# Patient Record
Sex: Male | Born: 1975 | Race: Black or African American | Hispanic: No | Smoking: Current every day smoker
Health system: Southern US, Community
[De-identification: ages and names within clinical notes are randomized; demographics above are authoritative.]

## PROBLEM LIST (undated history)

## (undated) DIAGNOSIS — IMO0002 Reserved for concepts with insufficient information to code with codable children: Secondary | ICD-10-CM

## (undated) DIAGNOSIS — K631 Perforation of intestine (nontraumatic): Secondary | ICD-10-CM

## (undated) DIAGNOSIS — E119 Type 2 diabetes mellitus without complications: Secondary | ICD-10-CM

## (undated) HISTORY — PX: ABDOMINAL SURGERY: SHX537

## (undated) HISTORY — PX: CHOLECYSTECTOMY: SHX55

---

## 2009-09-05 ENCOUNTER — Emergency Department (HOSPITAL_COMMUNITY): Admission: EM | Admit: 2009-09-05 | Discharge: 2009-09-05 | Payer: Self-pay | Admitting: Emergency Medicine

## 2010-02-28 ENCOUNTER — Emergency Department (HOSPITAL_COMMUNITY): Admission: EM | Admit: 2010-02-28 | Discharge: 2010-02-28 | Payer: Self-pay | Admitting: Emergency Medicine

## 2010-09-21 ENCOUNTER — Emergency Department (HOSPITAL_COMMUNITY)
Admission: EM | Admit: 2010-09-21 | Discharge: 2010-09-21 | Disposition: A | Discharge status: Home / Self Care | Payer: Medicare Other | Attending: Emergency Medicine | Admitting: Emergency Medicine

## 2010-09-21 DIAGNOSIS — K029 Dental caries, unspecified: Secondary | ICD-10-CM | POA: Insufficient documentation

## 2010-09-21 DIAGNOSIS — K089 Disorder of teeth and supporting structures, unspecified: Secondary | ICD-10-CM | POA: Insufficient documentation

## 2011-06-14 ENCOUNTER — Emergency Department (HOSPITAL_COMMUNITY)
Admission: EM | Admit: 2011-06-14 | Discharge: 2011-06-14 | Disposition: A | Discharge status: Home / Self Care | Payer: Medicare Other | Attending: Emergency Medicine | Admitting: Emergency Medicine

## 2011-06-14 ENCOUNTER — Encounter: Payer: Self-pay | Admitting: *Deleted

## 2011-06-14 DIAGNOSIS — F17200 Nicotine dependence, unspecified, uncomplicated: Secondary | ICD-10-CM | POA: Insufficient documentation

## 2011-06-14 DIAGNOSIS — X58XXXA Exposure to other specified factors, initial encounter: Secondary | ICD-10-CM | POA: Insufficient documentation

## 2011-06-14 DIAGNOSIS — S39012A Strain of muscle, fascia and tendon of lower back, initial encounter: Secondary | ICD-10-CM

## 2011-06-14 DIAGNOSIS — S335XXA Sprain of ligaments of lumbar spine, initial encounter: Secondary | ICD-10-CM | POA: Insufficient documentation

## 2011-06-14 MED ORDER — OXYCODONE-ACETAMINOPHEN 5-325 MG PO TABS
1.0000 | ORAL_TABLET | Freq: Once | ORAL | Status: AC
  Administered 2011-06-14: 1 via ORAL
  Filled 2011-06-14: qty 1

## 2011-06-14 MED ORDER — HYDROCODONE-ACETAMINOPHEN 5-325 MG PO TABS: ORAL_TABLET | ORAL | Status: AC

## 2011-06-14 MED ORDER — METHOCARBAMOL 500 MG PO TABS
1000.0000 mg | ORAL_TABLET | Freq: Once | ORAL | Status: AC
  Administered 2011-06-14: 1000 mg via ORAL
  Filled 2011-06-14: qty 2

## 2011-06-14 MED ORDER — CYCLOBENZAPRINE HCL 10 MG PO TABS: 10.0000 mg | ORAL_TABLET | Freq: Three times a day (TID) | ORAL | Status: AC | PRN

## 2011-06-14 MED ORDER — NAPROXEN 500 MG PO TABS: 500.0000 mg | ORAL_TABLET | Freq: Two times a day (BID) | ORAL | Status: AC

## 2011-06-14 NOTE — ED Provider Notes (Signed)
History     CSN: [id number] Arrival date & time: 06/14/2011  9:25 AM   First MD Initiated Contact with Patient 06/14/11 [phone]      Chief Complaint  Patient presents with  . Back Pain    (Consider location/radiation/quality/duration/timing/severity/associated sxs/prior treatment) HPI Comments: Patient c/o lower back pain and "stiffness" for three days.  Sx's began after raking leaves.  Pain is worse with movement, bending or twisting.  Improves with rest.  He denies numbness, weakness, incontinence of feces or urine or dysuria sx's.    Patient is a 35 y.o. male presenting with back pain. The history is provided by the patient.  Back Pain  This is a new problem. The current episode started more than 2 days ago. The problem occurs constantly. The problem has been gradually worsening. The pain is associated with twisting. The pain is present in the lumbar spine. The quality of the pain is described as aching. The pain is moderate. The symptoms are aggravated by bending, twisting and certain positions. The pain is the same all the time. Pertinent negatives include no chest pain, no fever, no numbness, no abdominal pain, no abdominal swelling, no bowel incontinence, no perianal numbness, no bladder incontinence, no dysuria, no pelvic pain, no leg pain, no paresthesias, no paresis, no tingling and no weakness. Treatments tried: tylenol. The treatment provided no relief.    History reviewed. No pertinent past medical history.  Past Surgical History  Procedure Date  . Abdominal surgery     History reviewed. No pertinent family history.  History  Substance Use Topics  . Smoking status: Current Everyday Smoker -- 0.5 packs/day  . Smokeless tobacco: Not on file  . Alcohol Use: Yes     occasional      Review of Systems  Constitutional: Negative for fever, chills and fatigue.  HENT: Negative for neck pain and neck stiffness.   Respiratory: Negative for cough, chest tightness, shortness  of breath and wheezing.   Cardiovascular: Negative for chest pain and palpitations.  Gastrointestinal: Negative for nausea, vomiting, abdominal pain, blood in stool and bowel incontinence.  Genitourinary: Negative for bladder incontinence, dysuria, hematuria, flank pain, scrotal swelling, penile pain, testicular pain and pelvic pain.  Musculoskeletal: Positive for back pain. Negative for myalgias, joint swelling and arthralgias.  Skin: Negative for rash.  Neurological: Negative for dizziness, tingling, weakness, numbness and paresthesias.  Hematological: Does not bruise/bleed easily.  All other systems reviewed and are negative.    Allergies  Penicillins  Home Medications   Current Outpatient Rx  Name Route Sig Dispense Refill  . ACETAMINOPHEN 500 MG PO TABS Oral Take 500 mg by mouth every 6 (six) hours as needed. pain       BP 131/77  Pulse 77  Temp(Src) 98.8 F (37.1 C) (Oral)  Resp 18  Ht 6\' 1"  (1.854 m)  Wt 205 lb (92.987 kg)  BMI 27.05 kg/m2  SpO2 100%  Physical Exam  Nursing note and vitals reviewed. Constitutional: He is oriented to person, place, and time. He appears well-developed and well-nourished. No distress.  HENT:  Head: Normocephalic and atraumatic.  Neck: Normal range of motion. Neck supple.  Cardiovascular: Normal rate, regular rhythm and normal heart sounds.   Pulmonary/Chest: Effort normal and breath sounds normal. No respiratory distress. He exhibits no tenderness.  Abdominal: Soft. He exhibits no distension and no mass. There is no tenderness. There is no rebound and no guarding.  Musculoskeletal: Normal range of motion. He exhibits tenderness. He exhibits no  edema.       Lumbar back: He exhibits tenderness and spasm. He exhibits normal range of motion, no bony tenderness, no swelling, no laceration and normal pulse.  Lymphadenopathy:    He has no cervical adenopathy.  Neurological: He is alert and oriented to person, place, and time. No cranial  nerve deficit or sensory deficit. He exhibits normal muscle tone. Coordination and gait normal.  Reflex Scores:      Patellar reflexes are 2+ on the right side and 2+ on the left side.      Achilles reflexes are 2+ on the right side and 2+ on the left side. Skin: Skin is warm and dry.    ED Course  Procedures (including critical care time)       MDM    10:14 AM ttp of the lumbar paraspinal muscles.  No bony tenderness on exam.  Pt is ambulatory, nml appearing gait.  DTR's nml. likley lumbar strain..  Will treat with pain meds, NSAIDs, muscle relaxer's.  Pt agrees to return here if sx's worsen.          Arnetha Silverthorne L. Woodson Macha, PA 06/14/11 1020

## 2011-06-14 NOTE — ED Provider Notes (Signed)
Medical screening examination/treatment/procedure(s) were performed by non-physician practitioner and as supervising physician I was immediately available for consultation/collaboration.   Benny Lennert, MD 06/14/11 [phone]

## 2011-06-14 NOTE — ED Notes (Signed)
Pt c/o lower back pain. Pt states pain began 3 days ago and has gotten worse. Pt denies injury.

## 2012-04-20 ENCOUNTER — Encounter (HOSPITAL_COMMUNITY): Payer: Self-pay | Admitting: *Deleted

## 2012-04-20 ENCOUNTER — Emergency Department (HOSPITAL_COMMUNITY)
Admission: EM | Admit: 2012-04-20 | Discharge: 2012-04-20 | Disposition: A | Discharge status: Home / Self Care | Payer: Medicare Other | Attending: Emergency Medicine | Admitting: Emergency Medicine

## 2012-04-20 DIAGNOSIS — S025XXA Fracture of tooth (traumatic), initial encounter for closed fracture: Secondary | ICD-10-CM

## 2012-04-20 DIAGNOSIS — X58XXXA Exposure to other specified factors, initial encounter: Secondary | ICD-10-CM | POA: Insufficient documentation

## 2012-04-20 DIAGNOSIS — F17200 Nicotine dependence, unspecified, uncomplicated: Secondary | ICD-10-CM | POA: Insufficient documentation

## 2012-04-20 DIAGNOSIS — Z880 Allergy status to penicillin: Secondary | ICD-10-CM | POA: Insufficient documentation

## 2012-04-20 MED ORDER — OXYCODONE-ACETAMINOPHEN 5-325 MG PO TABS
2.0000 | ORAL_TABLET | Freq: Once | ORAL | Status: AC
  Administered 2012-04-20: 2 via ORAL
  Filled 2012-04-20: qty 2

## 2012-04-20 MED ORDER — CLINDAMYCIN HCL 150 MG PO CAPS
300.0000 mg | ORAL_CAPSULE | Freq: Once | ORAL | Status: AC
  Administered 2012-04-20: 300 mg via ORAL
  Filled 2012-04-20: qty 2

## 2012-04-20 MED ORDER — OXYCODONE-ACETAMINOPHEN 5-325 MG PO TABS: 1.0000 | ORAL_TABLET | Freq: Four times a day (QID) | ORAL | Status: DC | PRN

## 2012-04-20 MED ORDER — CLINDAMYCIN HCL 150 MG PO CAPS: 150.0000 mg | ORAL_CAPSULE | Freq: Four times a day (QID) | ORAL | Status: DC

## 2012-04-20 MED ORDER — IBUPROFEN 800 MG PO TABS
800.0000 mg | ORAL_TABLET | Freq: Once | ORAL | Status: AC
  Administered 2012-04-20: 800 mg via ORAL
  Filled 2012-04-20: qty 1

## 2012-04-20 NOTE — ED Notes (Signed)
Family at bedside. Patient and family do not need anything at this time. 

## 2012-04-20 NOTE — ED Notes (Signed)
Pt discharged. Pt stable at time of discharge. Medications reviewed pt has no questions regarding discharge at this time. Pt voiced understanding of discharge instructions.  

## 2012-04-20 NOTE — ED Provider Notes (Signed)
History     CSN: [id number]  Arrival date & time 04/20/12  4098   First MD Initiated Contact with Patient 04/20/12 0411      Chief Complaint  Patient presents with  . Dental Pain    (Consider location/radiation/quality/duration/timing/severity/associated sxs/prior treatment) HPI.Marland KitchenMarland KitchenDental Periodontal Exam pain for approximately one week.    Patient broke off segment of left lower tooth accidentally.  Now with pain in left lower tooth. Chewing makes pain worse. Severity is moderate.   History reviewed. No pertinent past medical history.  Past Surgical History  Procedure Date  . Abdominal surgery     History reviewed. No pertinent family history.  History  Substance Use Topics  . Smoking status: Current Every Day Smoker -- 0.5 packs/day  . Smokeless tobacco: Not on file  . Alcohol Use: Yes     occasional      Review of Systems  All other systems reviewed and are negative.    Allergies  Penicillins  Home Medications   Current Outpatient Rx  Name Route Sig Dispense Refill  . ACETAMINOPHEN 500 MG PO TABS Oral Take 500 mg by mouth every 6 (six) hours as needed. pain     . CLINDAMYCIN HCL 150 MG PO CAPS Oral Take 1 capsule (150 mg total) by mouth every 6 (six) hours. 28 capsule 0  . NAPROXEN 500 MG PO TABS Oral Take 1 tablet (500 mg total) by mouth 2 (two) times daily. Prn pain.  Take with food 30 tablet 0  . OXYCODONE-ACETAMINOPHEN 5-325 MG PO TABS Oral Take 1-2 tablets by mouth every 6 (six) hours as needed for pain. 30 tablet 0    BP 132/78  Pulse 66  Temp 97.9 F (36.6 C) (Oral)  Resp 16  Ht 6\' 1"  (1.854 m)  Wt 200 lb (90.719 kg)  BMI 26.39 kg/m2  SpO2 95%  Physical Exam  Constitutional: He is oriented to person, place, and time. He appears well-developed and well-nourished.  HENT:       Tooth #19 fractured on medial anterior aspect  Musculoskeletal: Normal range of motion.  Neurological: He is alert and oriented to person, place, and time.  Skin:  Skin is warm and dry.  Psychiatric: He has a normal mood and affect.    ED Course  Procedures (including critical care time)  Labs Reviewed - No data to display No results found.   1. Tooth fracture       MDM  Discharge home on Cleocin, Percocet, ibuprofen. Follow up with Dentist        Donnetta Hutching, MD 04/20/12 [phone]

## 2012-04-20 NOTE — ED Notes (Signed)
Pt c/o dental pain to left lower jaw. Pt states he broke off half a tooth and has been hurting but has gotten to where he can't sleep.

## 2012-05-04 ENCOUNTER — Encounter (HOSPITAL_COMMUNITY): Payer: Self-pay | Admitting: Emergency Medicine

## 2012-05-04 ENCOUNTER — Emergency Department (HOSPITAL_COMMUNITY)
Admission: EM | Admit: 2012-05-04 | Discharge: 2012-05-04 | Disposition: A | Discharge status: Home / Self Care | Payer: Medicare Other | Attending: Emergency Medicine | Admitting: Emergency Medicine

## 2012-05-04 DIAGNOSIS — K0889 Other specified disorders of teeth and supporting structures: Secondary | ICD-10-CM

## 2012-05-04 DIAGNOSIS — K089 Disorder of teeth and supporting structures, unspecified: Secondary | ICD-10-CM | POA: Insufficient documentation

## 2012-05-04 DIAGNOSIS — F17200 Nicotine dependence, unspecified, uncomplicated: Secondary | ICD-10-CM | POA: Insufficient documentation

## 2012-05-04 DIAGNOSIS — Z880 Allergy status to penicillin: Secondary | ICD-10-CM | POA: Insufficient documentation

## 2012-05-04 DIAGNOSIS — Z79899 Other long term (current) drug therapy: Secondary | ICD-10-CM | POA: Insufficient documentation

## 2012-05-04 HISTORY — DX: Perforation of intestine (nontraumatic): K63.1

## 2012-05-04 MED ORDER — CLINDAMYCIN HCL 150 MG PO CAPS
300.0000 mg | ORAL_CAPSULE | Freq: Once | ORAL | Status: AC
  Administered 2012-05-04: 300 mg via ORAL
  Filled 2012-05-04: qty 2

## 2012-05-04 MED ORDER — HYDROCODONE-ACETAMINOPHEN 5-325 MG PO TABS
1.0000 | ORAL_TABLET | Freq: Once | ORAL | Status: AC
  Administered 2012-05-04: 1 via ORAL
  Filled 2012-05-04: qty 1

## 2012-05-04 MED ORDER — CLINDAMYCIN HCL 150 MG PO CAPS: 300.0000 mg | ORAL_CAPSULE | Freq: Three times a day (TID) | ORAL | Status: DC

## 2012-05-04 MED ORDER — IBUPROFEN 800 MG PO TABS
800.0000 mg | ORAL_TABLET | Freq: Once | ORAL | Status: AC
  Administered 2012-05-04: 800 mg via ORAL
  Filled 2012-05-04: qty 1

## 2012-05-04 MED ORDER — HYDROCODONE-ACETAMINOPHEN 5-325 MG PO TABS: 1.0000 | ORAL_TABLET | Freq: Four times a day (QID) | ORAL | Status: AC | PRN

## 2012-05-04 NOTE — ED Provider Notes (Signed)
Medical screening examination/treatment/procedure(s) were performed by non-physician practitioner and as supervising physician I was immediately available for consultation/collaboration. Devoria Albe, MD, Armando Gang   Ward Givens, MD 05/04/12 [phone]

## 2012-05-04 NOTE — ED Notes (Signed)
Patient with c/o left upper dental pain.

## 2012-05-04 NOTE — ED Provider Notes (Signed)
History     CSN: [id number]  Arrival date & time 05/04/12  1804   First MD Initiated Contact with Patient 05/04/12 1827      Chief Complaint  Patient presents with  . Dental Pain    (Consider location/radiation/quality/duration/timing/severity/associated sxs/prior treatment) HPI Comments: Tooth broke "a while back" but just started hurting a couple days. ahas an appt with a dentist for extraction "the middle of next month".  No fever or chills.  Patient is a 36 y.o. male presenting with tooth pain. The history is provided by the patient. No language interpreter was used.  Dental PainThe primary symptoms include mouth pain and dental injury. Primary symptoms do not include fever. The symptoms began 2 days ago. The symptoms are worsening.  Additional symptoms include: dental sensitivity to temperature. Additional symptoms do not include: purulent gums, trismus and facial swelling.    Past Medical History  Diagnosis Date  . Perforated bowel     Past Surgical History  Procedure Date  . Abdominal surgery   . Cholecystectomy     No family history on file.  History  Substance Use Topics  . Smoking status: Current Every Day Smoker -- 0.5 packs/day  . Smokeless tobacco: Not on file  . Alcohol Use: Yes     occasional      Review of Systems  Constitutional: Negative for fever and chills.  HENT: Positive for dental problem. Negative for facial swelling.   Cardiovascular: Negative for chest pain.  All other systems reviewed and are negative.    Allergies  Penicillins  Home Medications   Current Outpatient Rx  Name Route Sig Dispense Refill  . ACETAMINOPHEN 500 MG PO TABS Oral Take 500 mg by mouth every 6 (six) hours as needed. pain     . CLINDAMYCIN HCL 150 MG PO CAPS Oral Take 1 capsule (150 mg total) by mouth every 6 (six) hours. 28 capsule 0  . CLINDAMYCIN HCL 150 MG PO CAPS Oral Take 2 capsules (300 mg total) by mouth 3 (three) times daily. 60 capsule 0  .  HYDROCODONE-ACETAMINOPHEN 5-325 MG PO TABS Oral Take 1 tablet by mouth every 6 (six) hours as needed for pain. 15 tablet 0  . NAPROXEN 500 MG PO TABS Oral Take 1 tablet (500 mg total) by mouth 2 (two) times daily. Prn pain.  Take with food 30 tablet 0  . OXYCODONE-ACETAMINOPHEN 5-325 MG PO TABS Oral Take 1-2 tablets by mouth every 6 (six) hours as needed for pain. 30 tablet 0    BP 133/75  Pulse 82  Temp 98.4 F (36.9 C) (Oral)  Resp 17  Ht 6\' 1"  (1.854 m)  Wt 210 lb (95.255 kg)  BMI 27.71 kg/m2  SpO2 100%  Physical Exam  Nursing note and vitals reviewed. Constitutional: He is oriented to person, place, and time. He appears well-developed and well-nourished.  HENT:  Head: Normocephalic and atraumatic.  Mouth/Throat: Uvula is midline, oropharynx is clear and moist and mucous membranes are normal. No dental abscesses or uvula swelling.    Eyes: EOM are normal.  Neck: Normal range of motion.  Cardiovascular: Normal rate, regular rhythm and intact distal pulses.   Pulmonary/Chest: Effort normal. No respiratory distress.  Abdominal: Soft. He exhibits no distension. There is no tenderness.  Musculoskeletal: Normal range of motion.  Neurological: He is alert and oriented to person, place, and time.  Skin: Skin is warm and dry.  Psychiatric: He has a normal mood and affect. Judgment normal.  ED Course  Procedures (including critical care time)  Labs Reviewed - No data to display No results found.   1. Pain, dental       MDM  rx-clindamycin 300 mg TID x 10 days rx-hydrocodone, 15 Ibuprofen F/u with dentist as planned.         Evalina Field, [place] 05/04/12 [phone]

## 2015-05-15 ENCOUNTER — Emergency Department (HOSPITAL_COMMUNITY): Payer: Medicare Other

## 2015-05-15 ENCOUNTER — Encounter (HOSPITAL_COMMUNITY): Payer: Self-pay | Admitting: *Deleted

## 2015-05-15 ENCOUNTER — Emergency Department (HOSPITAL_COMMUNITY)
Admission: EM | Admit: 2015-05-15 | Discharge: 2015-05-15 | Disposition: A | Discharge status: Home / Self Care | Payer: Medicare Other | Attending: Emergency Medicine | Admitting: Emergency Medicine

## 2015-05-15 DIAGNOSIS — J40 Bronchitis, not specified as acute or chronic: Secondary | ICD-10-CM | POA: Insufficient documentation

## 2015-05-15 DIAGNOSIS — R05 Cough: Secondary | ICD-10-CM | POA: Diagnosis present

## 2015-05-15 DIAGNOSIS — Z720 Tobacco use: Secondary | ICD-10-CM | POA: Diagnosis not present

## 2015-05-15 DIAGNOSIS — Z880 Allergy status to penicillin: Secondary | ICD-10-CM | POA: Diagnosis not present

## 2015-05-15 DIAGNOSIS — Z8719 Personal history of other diseases of the digestive system: Secondary | ICD-10-CM | POA: Diagnosis not present

## 2015-05-15 MED ORDER — PREDNISONE 10 MG (21) PO TBPK: ORAL_TABLET | ORAL | Status: DC

## 2015-05-15 MED ORDER — ALBUTEROL SULFATE HFA 108 (90 BASE) MCG/ACT IN AERS: 1.0000 | INHALATION_SPRAY | Freq: Four times a day (QID) | RESPIRATORY_TRACT | Status: DC | PRN

## 2015-05-15 NOTE — ED Notes (Signed)
Pt states he has had a cough for 2 months, non-productive. Pt had pneumonia about 2 months ago as well. Denies n/v/d.

## 2015-05-15 NOTE — Discharge Instructions (Signed)

## 2015-05-15 NOTE — ED Provider Notes (Signed)
CSN: [id number]     Arrival date & time 05/15/15  1456 History   First MD Initiated Contact with Patient 05/15/15 1555     Chief Complaint  Patient presents with  . Cough     (Consider location/radiation/quality/duration/timing/severity/associated sxs/prior Treatment) Patient is a 39 y.o. male presenting with cough. The history is provided by the patient. No language interpreter was used.  Cough Cough characteristics:  Non-productive Severity:  Moderate Onset quality:  Gradual Duration:  8 weeks Timing:  Constant Progression:  Worsening Chronicity:  New Smoker: yes   Relieved by:  Nothing Worsened by:  Nothing tried Ineffective treatments:  None tried Associated symptoms: shortness of breath   Associated symptoms: no wheezing     Past Medical History  Diagnosis Date  . Perforated bowel [facility])    Past Surgical History  Procedure Laterality Date  . Abdominal surgery    . Cholecystectomy     No family history on file. Social History  Substance Use Topics  . Smoking status: Current Every Day Smoker -- 0.50 packs/day  . Smokeless tobacco: None  . Alcohol Use: Yes     Comment: occasional    Review of Systems  Respiratory: Positive for cough and shortness of breath. Negative for wheezing.   All other systems reviewed and are negative.     Allergies  Penicillins  Home Medications   Prior to Admission medications   Medication Sig Start Date End Date Taking? Authorizing Provider  acetaminophen (TYLENOL) 500 MG tablet Take 500 mg by mouth every 6 (six) hours as needed. pain     Historical Provider, MD  albuterol (PROVENTIL HFA;VENTOLIN HFA) 108 (90 BASE) MCG/ACT inhaler Inhale 1-2 puffs into the lungs every 6 (six) hours as needed for wheezing or shortness of breath. 05/15/15   Elson Areas, PA-C  predniSONE (STERAPRED UNI-PAK 21 TAB) 10 MG (21) TBPK tablet 6,5,4,3,2,1 taper 05/15/15   Lonia Skinner Braxdon Gappa, PA-C   BP 135/82 mmHg  Pulse 91  Temp(Src) 98.6 F (37 C)  (Oral)  Resp 16  Ht  (1.854 m)  Wt 195 lb (88.451 kg)  BMI 25.73 kg/m2  SpO2 97% Physical Exam  Constitutional: He appears well-developed and well-nourished.  HENT:  Head: Normocephalic and atraumatic.  Right Ear: External ear normal.  Left Ear: External ear normal.  Nose: Nose normal.  Mouth/Throat: Oropharynx is clear and moist.  Eyes: Conjunctivae and EOM are normal. Pupils are equal, round, and reactive to light.  Neck: Normal range of motion.  Cardiovascular: Normal rate and normal heart sounds.   Pulmonary/Chest: He has no wheezes. He exhibits no tenderness.  Abdominal: Soft.  Musculoskeletal: Normal range of motion.  Neurological: He is alert.  Skin: Skin is warm.  Psychiatric: He has a normal mood and affect.  Nursing note and vitals reviewed.   ED Course  Procedures (including critical care time) Labs Review Labs Reviewed - No data to display  Imaging Review Dg Chest 2 View  05/15/2015  CLINICAL DATA:  Patient complains of coughing.  History of smoking. EXAM: CHEST  2 VIEW COMPARISON:  None. FINDINGS: Cardiomediastinal silhouette is normal. Mediastinal contours appear intact. There is no evidence of focal airspace consolidation, pleural effusion or pneumothorax. The lungs are hyperinflated, with evidence of mild emphysematous changes. Osseous structures are without acute abnormality. Soft tissues are grossly normal. IMPRESSION: Hyperinflated lungs with emphysematous changes. No evidence of focal airspace consolidation.e Electronically Signed   By: Ted Mcalpine M.D.   On: 05/15/2015 15:43  I have personally reviewed and evaluated these images and lab results as part of my medical decision-making.   EKG Interpretation None      MDM Pt counseled on chest xray.   Pt has been a heavy smoker.  Pt counseled on stopping.     Final diagnoses:  Bronchitis    Albuterol Prednisone taper avs   Elson AreasLeslie K Elward Nocera, PA-C 05/15/15 1604  Lavera Guiseana Duo Liu,  MD 05/16/15 [phone]

## 2016-05-03 ENCOUNTER — Emergency Department (HOSPITAL_COMMUNITY)
Admission: EM | Admit: 2016-05-03 | Discharge: 2016-05-03 | Disposition: A | Discharge status: Home / Self Care | Payer: Medicare Other | Attending: Emergency Medicine | Admitting: Emergency Medicine

## 2016-05-03 ENCOUNTER — Emergency Department (HOSPITAL_COMMUNITY): Payer: Medicare Other

## 2016-05-03 ENCOUNTER — Encounter (HOSPITAL_COMMUNITY): Payer: Self-pay

## 2016-05-03 DIAGNOSIS — Y939 Activity, unspecified: Secondary | ICD-10-CM | POA: Insufficient documentation

## 2016-05-03 DIAGNOSIS — X58XXXA Exposure to other specified factors, initial encounter: Secondary | ICD-10-CM | POA: Insufficient documentation

## 2016-05-03 DIAGNOSIS — Y929 Unspecified place or not applicable: Secondary | ICD-10-CM | POA: Insufficient documentation

## 2016-05-03 DIAGNOSIS — Y999 Unspecified external cause status: Secondary | ICD-10-CM | POA: Diagnosis not present

## 2016-05-03 DIAGNOSIS — R1010 Upper abdominal pain, unspecified: Secondary | ICD-10-CM | POA: Diagnosis not present

## 2016-05-03 DIAGNOSIS — Z79899 Other long term (current) drug therapy: Secondary | ICD-10-CM | POA: Insufficient documentation

## 2016-05-03 DIAGNOSIS — F17200 Nicotine dependence, unspecified, uncomplicated: Secondary | ICD-10-CM | POA: Insufficient documentation

## 2016-05-03 DIAGNOSIS — J9801 Acute bronchospasm: Secondary | ICD-10-CM | POA: Diagnosis not present

## 2016-05-03 DIAGNOSIS — S39012A Strain of muscle, fascia and tendon of lower back, initial encounter: Secondary | ICD-10-CM | POA: Diagnosis not present

## 2016-05-03 DIAGNOSIS — R05 Cough: Secondary | ICD-10-CM

## 2016-05-03 DIAGNOSIS — R059 Cough, unspecified: Secondary | ICD-10-CM

## 2016-05-03 DIAGNOSIS — Z791 Long term (current) use of non-steroidal anti-inflammatories (NSAID): Secondary | ICD-10-CM | POA: Diagnosis not present

## 2016-05-03 HISTORY — DX: Reserved for concepts with insufficient information to code with codable children: IMO0002

## 2016-05-03 LAB — COMPREHENSIVE METABOLIC PANEL
ALBUMIN: 4.3 g/dL (ref 3.5–5.0)
ALT: 23 U/L (ref 17–63)
ANION GAP: 9 (ref 5–15)
AST: 22 U/L (ref 15–41)
Alkaline Phosphatase: 71 U/L (ref 38–126)
BUN: 9 mg/dL (ref 6–20)
CHLORIDE: 104 mmol/L (ref 101–111)
CO2: 23 mmol/L (ref 22–32)
Calcium: 9.5 mg/dL (ref 8.9–10.3)
Creatinine, Ser: 1.16 mg/dL (ref 0.61–1.24)
GFR calc Af Amer: >60 mL/min (ref >60)
Glucose, Bld: 97 mg/dL (ref 65–99)
POTASSIUM: 3.4 mmol/L — AB (ref 3.5–5.1)
Sodium: 136 mmol/L (ref 135–145)
Total Bilirubin: 0.7 mg/dL (ref 0.3–1.2)
Total Protein: 8 g/dL (ref 6.5–8.1)

## 2016-05-03 LAB — CBC WITH DIFFERENTIAL/PLATELET
BASOS ABS: 0 10*3/uL (ref 0.0–0.1)
BASOS PCT: 0 %
Eosinophils Absolute: 0.2 10*3/uL (ref 0.0–0.7)
Eosinophils Relative: 2 %
HEMATOCRIT: 45.2 % (ref 39.0–52.0)
HEMOGLOBIN: 16.3 g/dL (ref 13.0–17.0)
LYMPHS PCT: 38 %
Lymphs Abs: 4.3 10*3/uL — ABNORMAL HIGH (ref 0.7–4.0)
MCH: 33.3 pg (ref 26.0–34.0)
MCHC: 36.1 g/dL — ABNORMAL HIGH (ref 30.0–36.0)
MCV: 92.2 fL (ref 78.0–100.0)
MONO ABS: 0.9 10*3/uL (ref 0.1–1.0)
Monocytes Relative: 8 %
NEUTROS ABS: 5.8 10*3/uL (ref 1.7–7.7)
NEUTROS PCT: 52 %
Platelets: 305 10*3/uL (ref 150–400)
RBC: 4.9 MIL/uL (ref 4.22–5.81)
RDW: 12.9 % (ref 11.5–15.5)
WBC: 11.2 10*3/uL — AB (ref 4.0–10.5)

## 2016-05-03 LAB — LIPASE, BLOOD: LIPASE: 33 U/L (ref 11–51)

## 2016-05-03 MED ORDER — ALBUTEROL SULFATE HFA 108 (90 BASE) MCG/ACT IN AERS: 2.0000 | INHALATION_SPRAY | RESPIRATORY_TRACT | 0 refills | Status: DC | PRN

## 2016-05-03 MED ORDER — ACETAMINOPHEN 325 MG PO TABS
650.0000 mg | ORAL_TABLET | Freq: Once | ORAL | Status: AC
  Administered 2016-05-03: 650 mg via ORAL
  Filled 2016-05-03: qty 2

## 2016-05-03 MED ORDER — CYCLOBENZAPRINE HCL 10 MG PO TABS
10.0000 mg | ORAL_TABLET | Freq: Once | ORAL | Status: AC
  Administered 2016-05-03: 10 mg via ORAL
  Filled 2016-05-03: qty 1

## 2016-05-03 MED ORDER — FAMOTIDINE 20 MG PO TABS: 20.0000 mg | ORAL_TABLET | Freq: Two times a day (BID) | ORAL | 0 refills | Status: DC

## 2016-05-03 MED ORDER — POTASSIUM CHLORIDE CRYS ER 20 MEQ PO TBCR
40.0000 meq | EXTENDED_RELEASE_TABLET | Freq: Once | ORAL | Status: AC
  Administered 2016-05-03: 40 meq via ORAL
  Filled 2016-05-03: qty 2

## 2016-05-03 MED ORDER — GI COCKTAIL ~~LOC~~
30.0000 mL | Freq: Once | ORAL | Status: AC
  Administered 2016-05-03: 30 mL via ORAL
  Filled 2016-05-03: qty 30

## 2016-05-03 MED ORDER — PANTOPRAZOLE SODIUM 40 MG PO TBEC: 40.0000 mg | DELAYED_RELEASE_TABLET | Freq: Every day | ORAL | 0 refills | Status: DC

## 2016-05-03 MED ORDER — ALBUTEROL SULFATE (2.5 MG/3ML) 0.083% IN NEBU
5.0000 mg | INHALATION_SOLUTION | Freq: Once | RESPIRATORY_TRACT | Status: AC
  Administered 2016-05-03: 5 mg via RESPIRATORY_TRACT
  Filled 2016-05-03: qty 6

## 2016-05-03 MED ORDER — CYCLOBENZAPRINE HCL 10 MG PO TABS: 10.0000 mg | ORAL_TABLET | Freq: Three times a day (TID) | ORAL | 0 refills | Status: DC | PRN

## 2016-05-03 MED ORDER — FAMOTIDINE IN NACL 20-0.9 MG/50ML-% IV SOLN
20.0000 mg | Freq: Once | INTRAVENOUS | Status: AC
Start: 2016-05-03 — End: 2016-05-03
  Administered 2016-05-03: 20 mg via INTRAVENOUS
  Filled 2016-05-03: qty 50

## 2016-05-03 MED ORDER — ALBUTEROL SULFATE HFA 108 (90 BASE) MCG/ACT IN AERS
2.0000 | INHALATION_SPRAY | Freq: Once | RESPIRATORY_TRACT | Status: AC
  Administered 2016-05-03: 2 via RESPIRATORY_TRACT
  Filled 2016-05-03: qty 6.7

## 2016-05-03 NOTE — ED Notes (Signed)
Patient transported to X-ray 

## 2016-05-03 NOTE — ED Provider Notes (Signed)
[facility]-EMERGENCY DEPT Provider Note   CSN: [id number] Arrival date & time: 05/03/16  1731     History   Chief Complaint Chief Complaint  Patient presents with  . Cough  . Abdominal Pain    HPI Nelia ShiHarold A Kruser is a 40 y.o. male.  HPI  40 year old male presents with cough and abdominal pain. Has been having a cough for the past 1 month. Over the last 1 week or so it has become productive of yellow sputum. Has also been having bilateral back pain "for a while". Seems to be worse since having the cough. Has tried multiple medicines including Tylenol and ibuprofen and aspirin without relief. Has not had any nausea or vomiting. No fevers. Feel short of breath during coughing spells but at rest or with walking he does not feel short of breath. There is no chest pain. He has been having abdominal pain for the past 3 weeks. It is epigastric. Does worsen with eating. Has a history of peptic ulcer disease as well as a ruptured ulcer requiring emergent surgery. It does not feel that bad but he thinks is somewhat similar to an ulcer before. One time he took Pepto-Bismol last week and had a black stool but otherwise has been having normal stools without blood or melena. Occasionally takes Tums but does not take a PPI or H2 blocker.  Past Medical History:  Diagnosis Date  . Perforated bowel ([facility]CC)   . Ulcer (HCC)     There are no active problems to display for this patient.   Past Surgical History:  Procedure Laterality Date  . ABDOMINAL SURGERY    . CHOLECYSTECTOMY         Home Medications    Prior to Admission medications   Medication Sig Start Date End Date Taking? Authorizing Provider  acetaminophen (TYLENOL) 500 MG tablet Take 500 mg by mouth every 6 (six) hours as needed. pain    Yes Historical Provider, MD  ibuprofen (ADVIL,MOTRIN) 200 MG tablet Take 800 mg by mouth every 6 (six) hours as needed for moderate pain.   Yes Historical Provider, MD  Phenyleph-CPM-DM-APAP  (ALKA-SELTZER PLUS COLD & FLU PO) Take 2 tablets by mouth 2 (two) times daily.   Yes Historical Provider, MD  predniSONE (STERAPRED UNI-PAK 48 TAB) 10 MG (48) TBPK tablet Take 6 tablets by mouth daily.   Yes Historical Provider, MD  albuterol (PROVENTIL HFA;VENTOLIN HFA) 108 (90 Base) MCG/ACT inhaler Inhale 2 puffs into the lungs every 4 (four) hours as needed for wheezing or shortness of breath. 05/03/16   Pricilla LovelessScott Stelios Kirby, MD  cyclobenzaprine (FLEXERIL) 10 MG tablet Take 1 tablet (10 mg total) by mouth 3 (three) times daily as needed for muscle spasms. 05/03/16   Pricilla LovelessScott Kala Ambriz, MD  famotidine (PEPCID) 20 MG tablet Take 1 tablet (20 mg total) by mouth 2 (two) times daily. 05/03/16   Pricilla LovelessScott Takyia Sindt, MD  pantoprazole (PROTONIX) 40 MG tablet Take 1 tablet (40 mg total) by mouth daily. 05/03/16   Pricilla LovelessScott Auset Fritzler, MD    Family History No family history on file.  Social History Social History  Substance Use Topics  . Smoking status: Current Every Day Smoker    Packs/day: 0.50  . Smokeless tobacco: Never Used  . Alcohol use 1.2 - 1.8 oz/week    2 - 3 Cans of beer per week     Comment: daily     Allergies   Penicillins   Review of Systems Review of Systems  Constitutional: Negative for fever.  Respiratory: Positive for cough and shortness of breath (when coughing).   Cardiovascular: Negative for chest pain.  Gastrointestinal: Positive for abdominal pain. Negative for blood in stool, diarrhea, nausea and vomiting.  Musculoskeletal: Positive for back pain.  All other systems reviewed and are negative.    Physical Exam Updated Vital Signs BP 137/81 (BP Location: Left Arm)   Pulse 70   Temp 98.5 F (36.9 C) (Oral)   Resp 18   Ht 6' (1.829 m)   Wt 198 lb (89.8 kg)   SpO2 96%   BMI 26.85 kg/m   Physical Exam  Constitutional: He is oriented to person, place, and time. He appears well-developed and well-nourished. No distress.  HENT:  Head: Normocephalic and atraumatic.  Right  Ear: External ear normal.  Left Ear: External ear normal.  Nose: Nose normal.  Eyes: Right eye exhibits no discharge. Left eye exhibits no discharge.  Neck: Neck supple.  Cardiovascular: Normal rate, regular rhythm and normal heart sounds.   Pulmonary/Chest: Effort normal. He has wheezes (diffuse, expiratory).  Abdominal: Soft. He exhibits no distension. There is tenderness in the epigastric area. There is no rigidity, no rebound, no guarding and no CVA tenderness.  Musculoskeletal: He exhibits no edema.       Cervical back: He exhibits no tenderness.       Thoracic back: He exhibits no tenderness.       Lumbar back: He exhibits no tenderness.  Neurological: He is alert and oriented to person, place, and time.  Skin: Skin is warm and dry. He is not diaphoretic.  Nursing note and vitals reviewed.    ED Treatments / Results  Labs (all labs ordered are listed, but only abnormal results are displayed) Labs Reviewed  COMPREHENSIVE METABOLIC PANEL - Abnormal; Notable for the following:       Result Value   Potassium 3.4 (*)    All other components within normal limits  CBC WITH DIFFERENTIAL/PLATELET - Abnormal; Notable for the following:    WBC 11.2 (*)    MCHC 36.1 (*)    Lymphs Abs 4.3 (*)    All other components within normal limits  LIPASE, BLOOD    EKG  EKG Interpretation None       Radiology Dg Chest 2 View  Result Date: 05/03/2016 CLINICAL DATA:  Cough for 1 month.  Intermittent shortness of breath EXAM: CHEST  2 VIEW COMPARISON:  May 15, 2015 FINDINGS: There is no edema or consolidation. The heart size and pulmonary vascularity are normal. No adenopathy. No pneumothorax. No bone lesions. IMPRESSION: No edema or consolidation. Electronically Signed   By: Bretta Bang III M.D.   On: 05/03/2016 20:11    Procedures Procedures (including critical care time)  Medications Ordered in ED Medications  famotidine (PEPCID) IVPB 20 mg premix (0 mg Intravenous  Stopped 05/03/16 1911)  gi cocktail (Maalox,Lidocaine,Donnatal) (30 mLs Oral Given 05/03/16 1828)  albuterol (PROVENTIL) (2.5 MG/3ML) 0.083% nebulizer solution 5 mg (5 mg Nebulization Given 05/03/16 1839)  acetaminophen (TYLENOL) tablet 650 mg (650 mg Oral Given 05/03/16 1944)  cyclobenzaprine (FLEXERIL) tablet 10 mg (10 mg Oral Given 05/03/16 1944)  potassium chloride SA (K-DUR,KLOR-CON) CR tablet 40 mEq (40 mEq Oral Given 05/03/16 2037)  albuterol (PROVENTIL HFA;VENTOLIN HFA) 108 (90 Base) MCG/ACT inhaler 2 puff (2 puffs Inhalation Given 05/03/16 2033)     Initial Impression / Assessment and Plan / ED Course  I have reviewed the triage vital signs and the nursing notes.  Pertinent labs & imaging  results that were available during my care of the patient were reviewed by me and considered in my medical decision making (see chart for details).  Clinical Course  Comment By Time  Mild upper abd tenderness is probably from PUD vs abdominal wall strain from coughing x 1 month. Albuterol, CXR, labs, GI cocktail, pepcid. My suspicion for ruptured ulcer or peritonitis is quite low, and he doesn't have a GB. I don't think a scan is necessary at this point. Pricilla Loveless, MD 10/18 [phone]   Patient feels much better. GI cocktail/pepcid relieved abd pain. Likely has gastritis but I doubt perf or bleeding ulcer. Back pain is probably MSK given cough and has been present for "a while". Cough/dyspnea better after albuterol, will give inhaler. Counseled on stopping smoking. No resp distress and thus I think steroids would do more harm than good if he has an ulcer and only mild bronchospasm. F/u with GI  Final Clinical Impressions(s) / ED Diagnoses   Final diagnoses:  Cough  Bronchospasm  Upper abdominal pain  Back strain, initial encounter    New Prescriptions Discharge Medication List as of 05/03/2016  8:34 PM    START taking these medications   Details  cyclobenzaprine (FLEXERIL) 10 MG tablet  Take 1 tablet (10 mg total) by mouth 3 (three) times daily as needed for muscle spasms., Starting Wed 05/03/2016, Print    famotidine (PEPCID) 20 MG tablet Take 1 tablet (20 mg total) by mouth 2 (two) times daily., Starting Wed 05/03/2016, Print    pantoprazole (PROTONIX) 40 MG tablet Take 1 tablet (40 mg total) by mouth daily., Starting Wed 05/03/2016, Print         Pricilla Loveless, MD 05/03/16 2124

## 2016-05-03 NOTE — ED Triage Notes (Addendum)
Pt reports non productive coughing for one month. Some SOB due to coughing. Worsens at hs. Pt also reports that he has had burning abdominal pain for at least a week. And has vomited. Pt has hx of ulcers.

## 2016-05-03 NOTE — ED Notes (Signed)
Pt reports bilateral lower back pain in addition to productive cough and abdominal pain.

## 2021-11-13 ENCOUNTER — Emergency Department (HOSPITAL_COMMUNITY): Payer: 59

## 2021-11-13 ENCOUNTER — Emergency Department (HOSPITAL_COMMUNITY)
Admission: EM | Admit: 2021-11-13 | Discharge: 2021-11-13 | Disposition: A | Discharge status: Home / Self Care | Payer: 59 | Attending: Emergency Medicine | Admitting: Emergency Medicine

## 2021-11-13 ENCOUNTER — Encounter (HOSPITAL_COMMUNITY): Payer: Self-pay | Admitting: *Deleted

## 2021-11-13 ENCOUNTER — Other Ambulatory Visit: Payer: Self-pay

## 2021-11-13 DIAGNOSIS — E871 Hypo-osmolality and hyponatremia: Secondary | ICD-10-CM | POA: Diagnosis not present

## 2021-11-13 DIAGNOSIS — R823 Hemoglobinuria: Secondary | ICD-10-CM | POA: Insufficient documentation

## 2021-11-13 DIAGNOSIS — D72829 Elevated white blood cell count, unspecified: Secondary | ICD-10-CM | POA: Diagnosis not present

## 2021-11-13 DIAGNOSIS — E876 Hypokalemia: Secondary | ICD-10-CM | POA: Diagnosis not present

## 2021-11-13 DIAGNOSIS — F17200 Nicotine dependence, unspecified, uncomplicated: Secondary | ICD-10-CM | POA: Diagnosis not present

## 2021-11-13 DIAGNOSIS — R519 Headache, unspecified: Secondary | ICD-10-CM | POA: Diagnosis not present

## 2021-11-13 DIAGNOSIS — Z7984 Long term (current) use of oral hypoglycemic drugs: Secondary | ICD-10-CM | POA: Insufficient documentation

## 2021-11-13 DIAGNOSIS — E1165 Type 2 diabetes mellitus with hyperglycemia: Secondary | ICD-10-CM | POA: Diagnosis present

## 2021-11-13 DIAGNOSIS — R739 Hyperglycemia, unspecified: Secondary | ICD-10-CM

## 2021-11-13 LAB — COMPREHENSIVE METABOLIC PANEL
ALT: 22 U/L (ref 0–44)
AST: 13 U/L — ABNORMAL LOW (ref 15–41)
Albumin: 4 g/dL (ref 3.5–5.0)
Alkaline Phosphatase: 106 U/L (ref 38–126)
Anion gap: 10 (ref 5–15)
BUN: 10 mg/dL (ref 6–20)
CO2: 23 mmol/L (ref 22–32)
Calcium: 9.1 mg/dL (ref 8.9–10.3)
Chloride: 100 mmol/L (ref 98–111)
Creatinine, Ser: 0.92 mg/dL (ref 0.61–1.24)
GFR, Estimated: >60 mL/min (ref >60)
Glucose, Bld: 401 mg/dL — ABNORMAL HIGH (ref 70–99)
Potassium: 3.4 mmol/L — ABNORMAL LOW (ref 3.5–5.1)
Sodium: 133 mmol/L — ABNORMAL LOW (ref 135–145)
Total Bilirubin: 0.8 mg/dL (ref 0.3–1.2)
Total Protein: 7.8 g/dL (ref 6.5–8.1)

## 2021-11-13 LAB — URINALYSIS, ROUTINE W REFLEX MICROSCOPIC
Bacteria, UA: NONE SEEN
Bilirubin Urine: NEGATIVE
Glucose, UA: >=500 mg/dL — AB
Ketones, ur: 20 mg/dL — AB
Leukocytes,Ua: NEGATIVE
Nitrite: NEGATIVE
Protein, ur: 100 mg/dL — AB
Specific Gravity, Urine: 1.038 — ABNORMAL HIGH (ref 1.005–1.030)
pH: 6 (ref 5.0–8.0)

## 2021-11-13 LAB — CBC
HCT: 42.6 % (ref 39.0–52.0)
Hemoglobin: 15.2 g/dL (ref 13.0–17.0)
MCH: 31.5 pg (ref 26.0–34.0)
MCHC: 35.7 g/dL (ref 30.0–36.0)
MCV: 88.4 fL (ref 80.0–100.0)
Platelets: 209 10*3/uL (ref 150–400)
RBC: 4.82 MIL/uL (ref 4.22–5.81)
RDW: 12.8 % (ref 11.5–15.5)
WBC: 12 10*3/uL — ABNORMAL HIGH (ref 4.0–10.5)
nRBC: 0 % (ref 0.0–0.2)

## 2021-11-13 LAB — CBG MONITORING, ED
Glucose-Capillary: 412 mg/dL — ABNORMAL HIGH (ref 70–99)
Glucose-Capillary: 297 mg/dL — ABNORMAL HIGH (ref 70–99)

## 2021-11-13 MED ORDER — SODIUM CHLORIDE 0.9 % IV BOLUS
1000.0000 mL | Freq: Once | INTRAVENOUS | Status: AC
  Administered 2021-11-13: 1000 mL via INTRAVENOUS

## 2021-11-13 MED ORDER — METFORMIN HCL 500 MG PO TABS: 500.0000 mg | ORAL_TABLET | Freq: Every day | ORAL | 0 refills | Status: DC

## 2021-11-13 NOTE — ED Notes (Signed)
Patient transported to CT 

## 2021-11-13 NOTE — ED Provider Notes (Signed)
? EMERGENCY DEPARTMENT ?Provider Note ? ? ?CSN: [id number] ?Arrival date & time: 11/13/21  1250 ? ?  ? ?History ?Chief Complaint  ?Patient presents with  ? Hyperglycemia  ? ? ?Corey Harrell is a 46 y.o. male who presents to the emergency department with a 1 week history of headache, blurred vision, polyuria, polyphagia, and polydipsia.  Patient's significant other has type 2 diabetes and checked his sugar and it read above 600 last week.  Patient did not want to come to the emergency department at that time.  He does not carry diagnosis of diabetes.  His symptoms have been persistent since then and blood sugar was high again today which prompted his arrival to the emergency department.  No chest pain, shortness of breath, focal weakness or numbness.  No fever or chills. ? ? ?Hyperglycemia ? ?  ? ?Home Medications ?Prior to Admission medications   ?Medication Sig Start Date End Date Taking? Authorizing Provider  ?acetaminophen (TYLENOL) 500 MG tablet Take 500 mg by mouth every 6 (six) hours as needed. pain    Yes [provider]  ?ibuprofen (ADVIL,MOTRIN) 200 MG tablet Take 800 mg by mouth every 6 (six) hours as needed for moderate pain.   Yes [provider]  ?metFORMIN (GLUCOPHAGE) 500 MG tablet Take 1 tablet (500 mg total) by mouth daily. 11/13/21  Yes Teressa Lower, PA-C  ?   ? ?Allergies    ?Penicillins   ? ?Review of Systems   ?Review of Systems  ?All other systems reviewed and are negative. ? ?Physical Exam ?Updated Vital Signs ?BP 131/72 (BP Location: Left Arm)   Pulse 77   Temp 98.6 ?F (37 ?C) (Oral)   Resp 18   Ht 6' (1.829 m)   Wt 97.3 kg   SpO2 97%   BMI 29.09 kg/m?  ?Physical Exam ?Vitals and nursing note reviewed.  ?Constitutional:   ?   General: He is not in acute distress. ?   Appearance: Normal appearance.  ?HENT:  ?   Head: Normocephalic and atraumatic.  ?Eyes:  ?   General:     ?   Right eye: No discharge.     ?   Left eye: No discharge.  ?Cardiovascular:  ?    Comments: Regular rate and rhythm.  S1/S2 are distinct without any evidence of murmur, rubs, or gallops.  Radial pulses are 2+ bilaterally.  Dorsalis pedis pulses are 2+ bilaterally.  No evidence of pedal edema. ?Pulmonary:  ?   Comments: Clear to auscultation bilaterally.  Normal effort.  No respiratory distress.  No evidence of wheezes, rales, or rhonchi heard throughout. ?Abdominal:  ?   General: Abdomen is flat. Bowel sounds are normal. There is no distension.  ?   Tenderness: There is no abdominal tenderness. There is no guarding or rebound.  ?Musculoskeletal:     ?   General: Normal range of motion.  ?   Cervical back: Neck supple.  ?Skin: ?   General: Skin is warm and dry.  ?   Findings: No rash.  ?Neurological:  ?   General: No focal deficit present.  ?   Mental Status: He is alert and oriented to person, place, and time. Mental status is at baseline.  ?   Cranial Nerves: No cranial nerve deficit.  ?   Sensory: No sensory deficit.  ?   Motor: No weakness.  ?Psychiatric:     ?   Mood and Affect: Mood normal.     ?  Behavior: Behavior normal.  ? ? ?ED Results / Procedures / Treatments   ?Labs ?(all labs ordered are listed, but only abnormal results are displayed) ?Labs Reviewed  ?CBC - Abnormal; Notable for the following components:  ?    Result Value  ? WBC 12.0 (*)   ? All other components within normal limits  ?URINALYSIS, ROUTINE W REFLEX MICROSCOPIC - Abnormal; Notable for the following components:  ? Specific Gravity, Urine 1.038 (*)   ? Glucose, UA >=500 (*)   ? Hgb urine dipstick SMALL (*)   ? Ketones, ur 20 (*)   ? Protein, ur 100 (*)   ? All other components within normal limits  ?COMPREHENSIVE METABOLIC PANEL - Abnormal; Notable for the following components:  ? Sodium 133 (*)   ? Potassium 3.4 (*)   ? Glucose, Bld 401 (*)   ? AST 13 (*)   ? All other components within normal limits  ?CBG MONITORING, ED - Abnormal; Notable for the following components:  ? Glucose-Capillary 412 (*)   ? All other  components within normal limits  ?CBG MONITORING, ED - Abnormal; Notable for the following components:  ? Glucose-Capillary 297 (*)   ? All other components within normal limits  ? ? ?EKG ?None ? ?Radiology ?CT Head Wo Contrast ? ?Result Date: 11/13/2021 ?CLINICAL DATA:  46 year old male with acute headache and blurred vision. EXAM: CT HEAD WITHOUT CONTRAST TECHNIQUE: Contiguous axial images were obtained from the base of the skull through the vertex without intravenous contrast. RADIATION DOSE REDUCTION: This exam was performed according to the departmental dose-optimization program which includes automated exposure control, adjustment of the mA and/or kV according to patient size and/or use of iterative reconstruction technique. COMPARISON:  None. FINDINGS: Brain: No evidence of acute infarction, hemorrhage, hydrocephalus, extra-axial collection or mass lesion/mass effect. Vascular: No hyperdense vessel or unexpected calcification. Skull: Normal. Negative for fracture or focal lesion. Sinuses/Orbits: No acute finding. Mucous retention cyst in the LEFT maxillary sinus noted. Other: None. IMPRESSION: No evidence of acute intracranial abnormality. Electronically Signed   By: Harmon PierJeffrey  Hu M.D.   On: 11/13/2021 15:08   ? ?Procedures ?Procedures  ? ? ?Medications Ordered in ED ?Medications  ?sodium chloride 0.9 % bolus 1,000 mL (1,000 mLs Intravenous New Bag/Given 11/13/21 1402)  ? ? ?ED Course/ Medical Decision Making/ A&P ?  ?                        ?Medical Decision Making ?Amount and/or Complexity of Data Reviewed ?Labs: ordered. ?Radiology: ordered. ? ?Risk ?Prescription drug management. ? ? ?This patient presents to the ED for concern of elevated blood sugars, this involves an extensive number of treatment options, and is a complaint that carries with it a high risk of complications and morbidity.  The differential diagnosis includes symptomatic hypoglycemia, UTI, electrolyte abnormalities, dehydration.  I did  consider stroke over this is less likely concerned that his neurological exam is normal and history does not fit. ? ? ?Co morbidities that complicate the patient evaluation ? ?Past Medical History:  ?Diagnosis Date  ? Perforated bowel (HCC)   ? Ulcer   ? ? ?Additional history obtained: ? ?Additional history obtained from nursing note ? ? ?Lab Tests: ? ?I Ordered, and personally interpreted labs.  The pertinent results include: CBC shows evidence of leukocytosis.  CMP shows mild hyponatremia and hypokalemia.  Glucose initially was in the 500s.  Repeat CBG had a glucose in the 400s.  After  fluids, patient had blood sugar just below 300.  Urinalysis showed glucosuria and small amount of hemoglobinuria but without infection. ? ? ?Imaging Studies ordered: ? ?I ordered imaging studies including CT head ?I independently visualized and interpreted imaging which showed no evidence of intracranial bleed ?I agree with the radiologist interpretation ? ? ?Cardiac Monitoring: ? ?The patient was maintained on a cardiac monitor.  I personally viewed and interpreted the cardiac monitored which showed an underlying rhythm of: Normal sinus rhythm ? ? ?Medicines ordered and prescription drug management: ? ?I ordered medication including fluids for hyperglycemia ?Reevaluation of the patient after these medicines showed that the patient improved ?I have reviewed the patients home medicines and have made adjustments as needed ? ? ?Test Considered: ? ?N/A ? ? ?Critical Interventions: ? ?Fluids ? ? ? ?Problem List / ED Course: ? ?Patient presents to the emergency [facility] today with elevated blood sugars and symptoms of hyperglycemia.  Patient has no diagnosis of diabetes mellitus.  He does not go to the doctor regularly.  After aggressive fluid resuscitation patient's sugar did improve.  I educated the patient on hyperglycemia and the dangers and long-term effects.  Patient understood.  Patient does not have a primary care doctor.  I will  start him on 500 mg daily of metformin until he establishes care with a primary care doctor and they can make medication adjustments as needed.  CT head was negative for any intracranial hemorrhage.  Strict turn pre

## 2021-11-13 NOTE — ED Triage Notes (Signed)
Pt seen at [facility] and sent to ED.  CBG 508 today. +blurred vision and HA.  Dizziness last week.  ?hemorrhoids  ?

## 2021-11-13 NOTE — Discharge Instructions (Addendum)
Your blood sugar was elevated today.  It is important that you follow-up with primary care as you might be developing diabetes.  I prescribed you metformin.  Please take 500 mg every day until you follow-up with primary care and they can make the necessary adjustments.  I would like for you to return to the emergency department for any worsening symptoms you might have. ?

## 2021-11-13 NOTE — ED Notes (Signed)
Patient given urinal and educated on need for urine sample.  ?

## 2021-11-13 NOTE — ED Notes (Signed)
Pt has not been diagnosed with diabetes but has increased in thirst, urination and hunger per SO.  SO used her CBG machine and checked pt's blood sugars and were running in 600's last week.   ?

## 2022-12-08 IMAGING — CT CT HEAD W/O CM
3 of 4 series · 15 of 47 positions shown, 18 images · non-contrast
Comparison: None.

CLINICAL DATA: 45-year-old male with acute headache and blurred
vision.



[Series 3: head w o · axial · 0.45mm/px · z∈[-53,+87]mm · 9 of 34 slices shown, 12 images]
[im 3/34  brain]
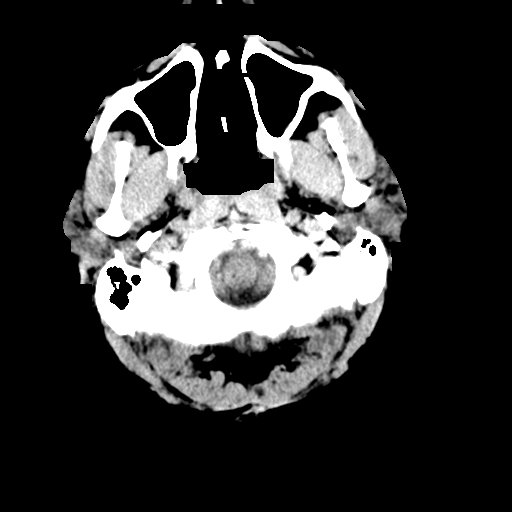
[im 3/34  bone]
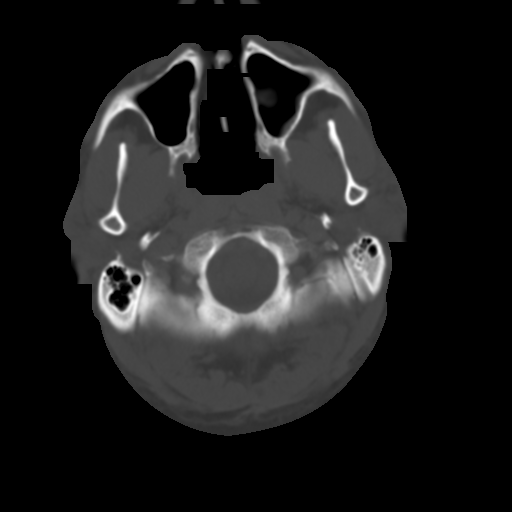
[im 8/34  brain]
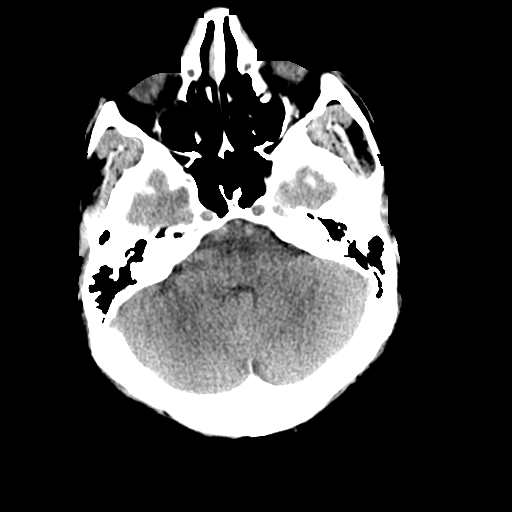
[im 10/34  brain]
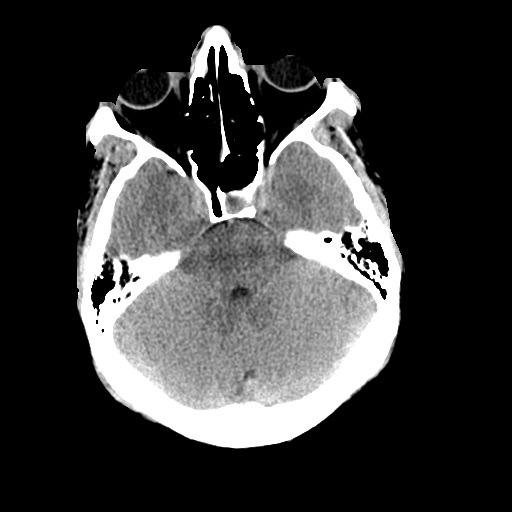
[im 15/34  brain]
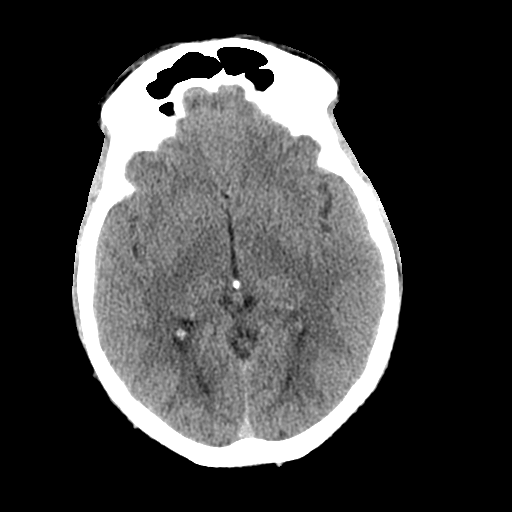
[im 17/34  brain]
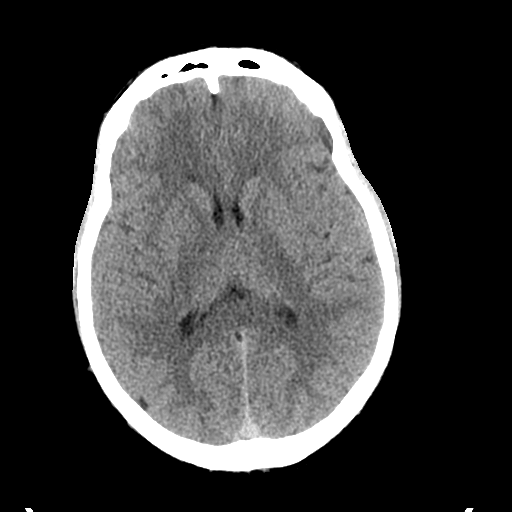
[im 17/34  bone]
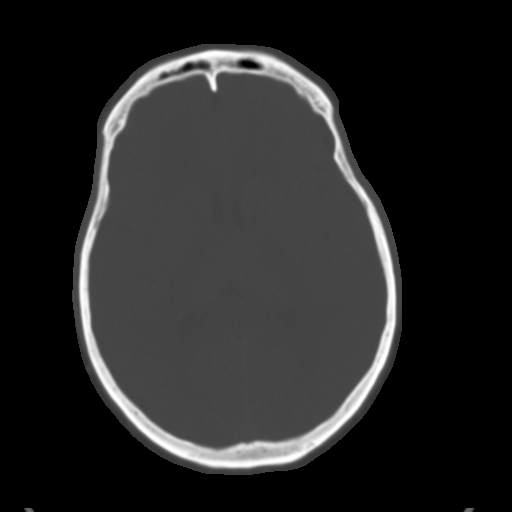
[im 19/34  brain]
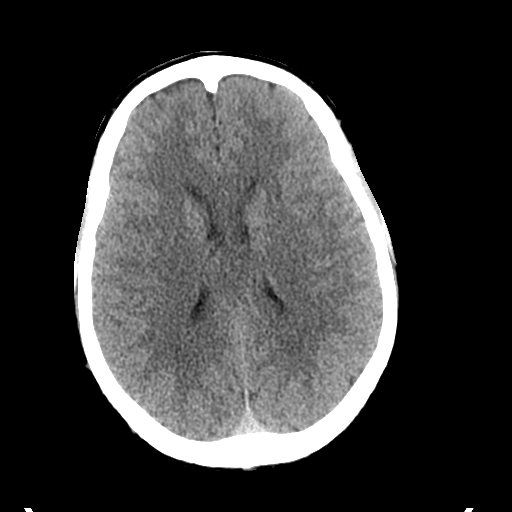
[im 24/34  brain]
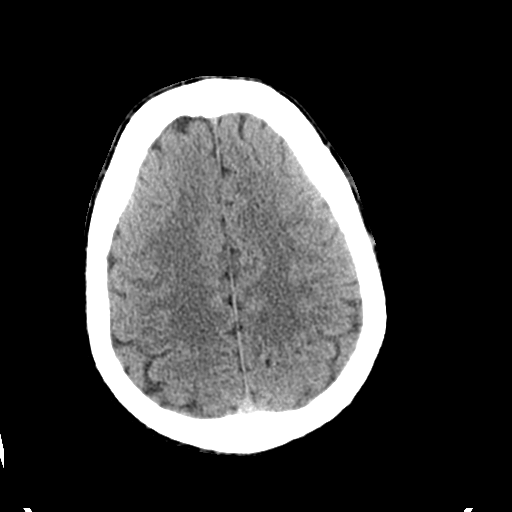
[im 26/34  brain]
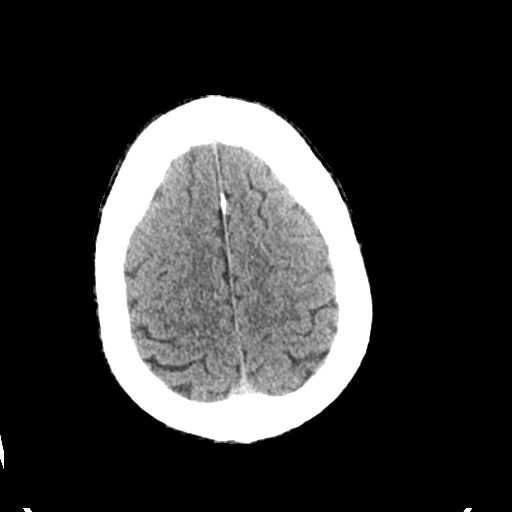
[im 31/34  brain]
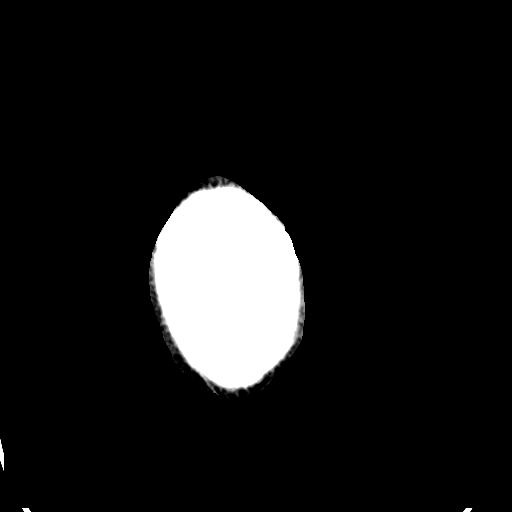
[im 31/34  bone]
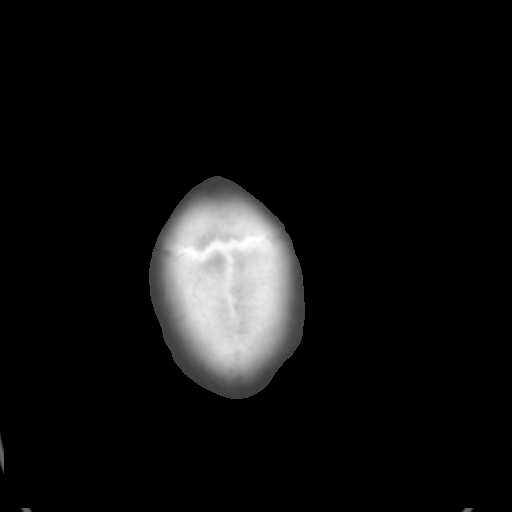

[Series 4: coronal soft · coronal · 0.34mm/px · 3 of 72 slices shown]
[im 24/72  brain]
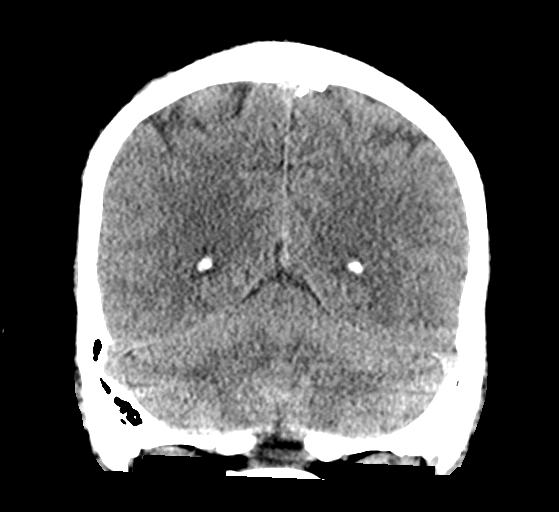
[im 32/72  brain]
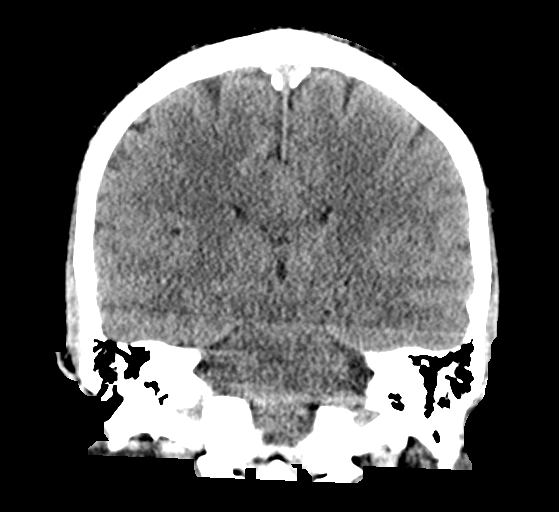
[im 40/72  brain]
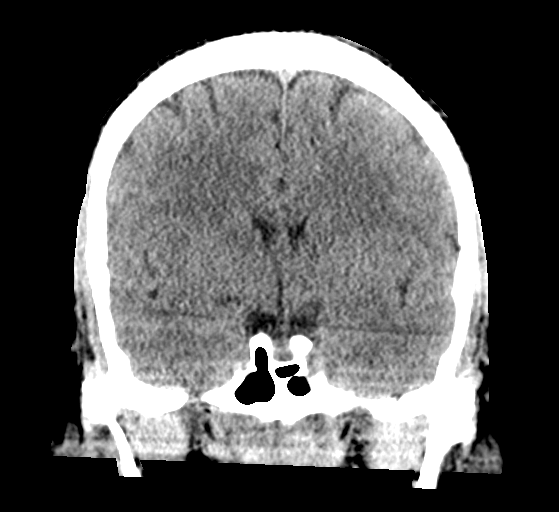

[Series 5: sagittal soft · sagittal · 0.34mm/px · 3 of 64 slices shown]
[im 22/64  brain]
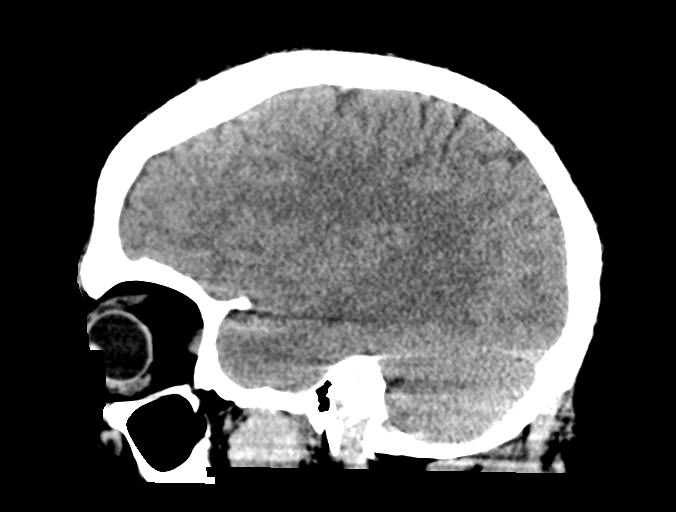
[im 32/64  brain]
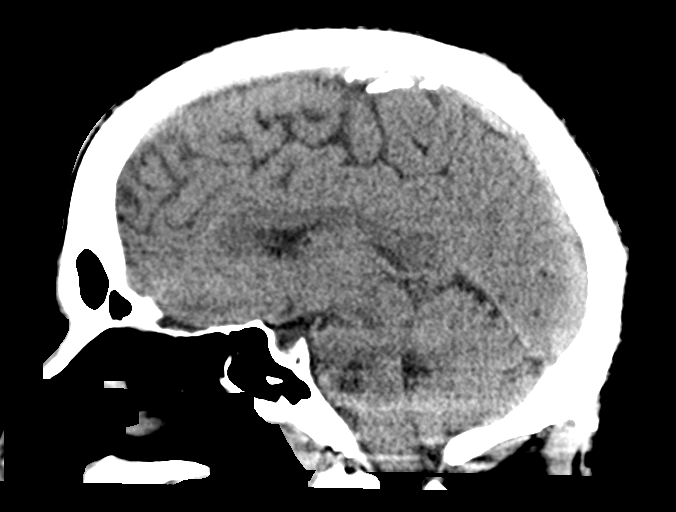
[im 43/64  brain]
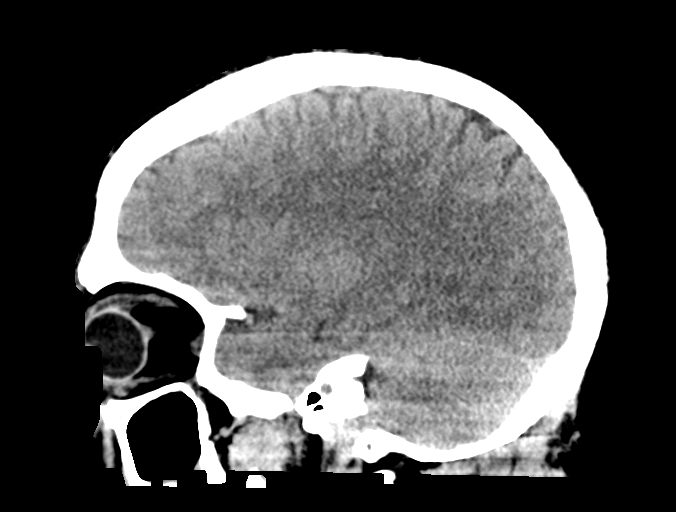

[15 of 47 positions shown; findings below may reference images not displayed]

FINDINGS: Brain: No evidence of acute infarction, hemorrhage, hydrocephalus,
extra-axial collection or mass lesion/mass effect.

Vascular: No hyperdense vessel or unexpected calcification.

Skull: Normal. Negative for fracture or focal lesion.

Sinuses/Orbits: No acute finding. Mucous retention cyst in the LEFT
maxillary sinus noted.

Other: None.
IMPRESSION: No evidence of acute intracranial abnormality.

## 2022-12-12 ENCOUNTER — Other Ambulatory Visit: Payer: Self-pay

## 2022-12-12 ENCOUNTER — Emergency Department (HOSPITAL_COMMUNITY)
Admission: EM | Admit: 2022-12-12 | Discharge: 2022-12-12 | Discharge status: Against Medical Advice | Payer: 59 | Attending: Emergency Medicine | Admitting: Emergency Medicine

## 2022-12-12 ENCOUNTER — Encounter (HOSPITAL_COMMUNITY): Payer: Self-pay | Admitting: Emergency Medicine

## 2022-12-12 DIAGNOSIS — W57XXXA Bitten or stung by nonvenomous insect and other nonvenomous arthropods, initial encounter: Secondary | ICD-10-CM | POA: Insufficient documentation

## 2022-12-12 DIAGNOSIS — E119 Type 2 diabetes mellitus without complications: Secondary | ICD-10-CM | POA: Diagnosis not present

## 2022-12-12 DIAGNOSIS — Z5321 Procedure and treatment not carried out due to patient leaving prior to being seen by health care provider: Secondary | ICD-10-CM | POA: Diagnosis not present

## 2022-12-12 DIAGNOSIS — S0096XA Insect bite (nonvenomous) of unspecified part of head, initial encounter: Secondary | ICD-10-CM | POA: Diagnosis present

## 2022-12-12 DIAGNOSIS — L738 Other specified follicular disorders: Secondary | ICD-10-CM

## 2022-12-12 LAB — CBC WITH DIFFERENTIAL/PLATELET
Abs Immature Granulocytes: 0.04 10*3/uL (ref 0.00–0.07)
Basophils Absolute: 0.1 10*3/uL (ref 0.0–0.1)
Basophils Relative: 1 %
Eosinophils Absolute: 0.4 10*3/uL (ref 0.0–0.5)
Eosinophils Relative: 3 %
HCT: 46 % (ref 39.0–52.0)
Hemoglobin: 15.8 g/dL (ref 13.0–17.0)
Immature Granulocytes: 0 %
Lymphocytes Relative: 21 %
Lymphs Abs: 2.6 10*3/uL (ref 0.7–4.0)
MCH: 31.5 pg (ref 26.0–34.0)
MCHC: 34.3 g/dL (ref 30.0–36.0)
MCV: 91.8 fL (ref 80.0–100.0)
Monocytes Absolute: 1.1 10*3/uL — ABNORMAL HIGH (ref 0.1–1.0)
Monocytes Relative: 9 %
Neutro Abs: 8.4 10*3/uL — ABNORMAL HIGH (ref 1.7–7.7)
Neutrophils Relative %: 66 %
Platelets: 325 10*3/uL (ref 150–400)
RBC: 5.01 MIL/uL (ref 4.22–5.81)
RDW: 12.8 % (ref 11.5–15.5)
WBC: 12.6 10*3/uL — ABNORMAL HIGH (ref 4.0–10.5)
nRBC: 0 % (ref 0.0–0.2)

## 2022-12-12 LAB — BASIC METABOLIC PANEL
Anion gap: 16 — ABNORMAL HIGH (ref 5–15)
BUN: 13 mg/dL (ref 6–20)
CO2: 22 mmol/L (ref 22–32)
Calcium: 9.5 mg/dL (ref 8.9–10.3)
Chloride: 99 mmol/L (ref 98–111)
Creatinine, Ser: 1.06 mg/dL (ref 0.61–1.24)
GFR, Estimated: >60 mL/min (ref >60)
Glucose, Bld: 100 mg/dL — ABNORMAL HIGH (ref 70–99)
Potassium: 3.7 mmol/L (ref 3.5–5.1)
Sodium: 137 mmol/L (ref 135–145)

## 2022-12-12 LAB — CBG MONITORING, ED: Glucose-Capillary: 95 mg/dL (ref 70–99)

## 2022-12-12 MED ORDER — DOXYCYCLINE HYCLATE 100 MG PO CAPS
100.0000 mg | ORAL_CAPSULE | Freq: Two times a day (BID) | ORAL | 0 refills | Status: DC
Start: 2022-12-12 — End: 2024-01-19

## 2022-12-12 MED ORDER — TRAMADOL HCL 50 MG PO TABS: 50.0000 mg | ORAL_TABLET | Freq: Four times a day (QID) | ORAL | 0 refills | Status: DC | PRN

## 2022-12-12 NOTE — ED Triage Notes (Signed)
Pt c/o insect bite or ingrown hair to right lower jaw under facial hair. Pt has been scratching the area and says he has had sweats and chills. Pt has been dizzy and sweaty. Hx DM2

## 2023-04-30 ENCOUNTER — Other Ambulatory Visit: Payer: Self-pay

## 2023-04-30 ENCOUNTER — Encounter (HOSPITAL_COMMUNITY): Payer: Self-pay

## 2023-04-30 ENCOUNTER — Emergency Department (HOSPITAL_COMMUNITY)
Admission: EM | Admit: 2023-04-30 | Discharge: 2023-04-30 | Disposition: A | Discharge status: Home / Self Care | Payer: 59 | Attending: Emergency Medicine | Admitting: Emergency Medicine

## 2023-04-30 DIAGNOSIS — M5432 Sciatica, left side: Secondary | ICD-10-CM | POA: Diagnosis not present

## 2023-04-30 DIAGNOSIS — M549 Dorsalgia, unspecified: Secondary | ICD-10-CM | POA: Diagnosis present

## 2023-04-30 MED ORDER — KETOROLAC TROMETHAMINE 15 MG/ML IJ SOLN
15.0000 mg | Freq: Once | INTRAMUSCULAR | Status: AC
  Administered 2023-04-30: 15 mg via INTRAMUSCULAR
  Filled 2023-04-30: qty 1

## 2023-04-30 MED ORDER — HYDROCODONE-ACETAMINOPHEN 5-325 MG PO TABS: 1.0000 | ORAL_TABLET | Freq: Four times a day (QID) | ORAL | 0 refills | Status: DC | PRN

## 2023-04-30 MED ORDER — METHOCARBAMOL 500 MG PO TABS: 500.0000 mg | ORAL_TABLET | Freq: Two times a day (BID) | ORAL | 0 refills | Status: DC

## 2023-04-30 MED ORDER — LIDOCAINE 5 % EX PTCH
1.0000 | MEDICATED_PATCH | Freq: Once | CUTANEOUS | Status: DC
  Administered 2023-04-30: 1 via TRANSDERMAL
  Filled 2023-04-30: qty 1

## 2023-04-30 MED ORDER — DEXAMETHASONE 4 MG PO TABS
4.0000 mg | ORAL_TABLET | Freq: Once | ORAL | Status: AC
  Administered 2023-04-30: 4 mg via ORAL
  Filled 2023-04-30: qty 1

## 2023-04-30 MED ORDER — HYDROCODONE-ACETAMINOPHEN 5-325 MG PO TABS
1.0000 | ORAL_TABLET | Freq: Once | ORAL | Status: AC
  Administered 2023-04-30: 1 via ORAL
  Filled 2023-04-30: qty 1

## 2023-04-30 MED ORDER — LIDOCAINE 5 % EX PTCH: 1.0000 | MEDICATED_PATCH | CUTANEOUS | 0 refills | Status: DC

## 2023-04-30 MED ORDER — METHYLPREDNISOLONE 4 MG PO TBPK: ORAL_TABLET | ORAL | 0 refills | Status: DC

## 2023-04-30 NOTE — Discharge Instructions (Addendum)
As we discussed, your symptoms are likely due to sciatica.  I have given you a prescription for a Medrol Dosepak for you to take as prescribed in its entirety for management of this.  I have also given you a prescription for Robaxin which is a muscle relaxer which can help you relax and get sleep at night.  Do not drive or operate heavy machinery while taking this medication as it can make you sleepy.  I have given you lidocaine patches as well for you to stick on your back in the area that hurts.  Additionally, I have given you a few doses of Vicodin which is a narcotic pain medication for you to take as prescribed as needed for severe pain only.  Do not drive or operate heavy machinery while taking this medication as it can be sedating.  Relative rest is also important to not lift greater than 10 pounds bending or twisting at the waist.  Please follow-up with your family physician.  The other thing that really seems to benefit patients is physical therapy which your doctor may send you for.  Please return to the emergency department for new numbness or weakness to your arms or legs. Difficulty with urinating or urinating or pooping on yourself.  Also if you cannot feel toilet paper when you wipe or get a fever.   Take 4 over the counter ibuprofen tablets 3 times a day or 2 over-the-counter naproxen tablets twice a day for pain. Also take tylenol 1000mg (2 extra strength) four times a day.

## 2023-04-30 NOTE — ED Triage Notes (Signed)
Back pain x1 month Lumbar Radiates down LEFT leg Pt denies trauma, stated he has been walking more

## 2023-04-30 NOTE — ED Provider Notes (Signed)
[place] EMERGENCY DEPARTMENT AT [facility] Provider Note   CSN: [id number] Arrival date & time: 04/30/23  1120     History  Chief Complaint  Patient presents with   Back Pain    Corey Harrell is a 47 y.o. male.  Patient with noncontributory past medical history presents today with complaints of back pain.  He states that same began about 1 month ago when his car broke down and he had to walk many miles to get back home.  He states the morning after this he woke up with pain in his back that radiates down into his left leg.  He has tried Tylenol and Motrin as well as naproxen for management of this and has been sleeping on the floor as this is the only position where he can get comfortable.  He has had minimal relief with this. He denies any specific trauma, no numbness/tingling in his extremities, no weakness. No loss of bowel or bladder function or saddle paresthesias.  No history of IVDU or malignancy or long-term steroid use.  He denies any history of similar symptoms previously.  No fevers or chills.  No urinary symptoms.  The history is provided by the patient. No language interpreter was used.  Back Pain      Home Medications Prior to Admission medications   Medication Sig Start Date End Date Taking? Authorizing Provider  acetaminophen (TYLENOL) 500 MG tablet Take 500 mg by mouth every 6 (six) hours as needed. pain     [provider]  doxycycline (VIBRAMYCIN) 100 MG capsule Take 1 capsule (100 mg total) by mouth 2 (two) times daily. 12/12/22   Burgess Amor, PA-C  ibuprofen (ADVIL,MOTRIN) 200 MG tablet Take 800 mg by mouth every 6 (six) hours as needed for moderate pain.    [provider]  metFORMIN (GLUCOPHAGE) 500 MG tablet Take 1 tablet (500 mg total) by mouth daily. 11/13/21   Honor Loh M, PA-C  traMADol (ULTRAM) 50 MG tablet Take 1 tablet (50 mg total) by mouth every 6 (six) hours as needed for moderate pain. 12/12/22   Burgess Amor,  PA-C      Allergies    Penicillins    Review of Systems   Review of Systems  Musculoskeletal:  Positive for back pain.  All other systems reviewed and are negative.   Physical Exam Updated Vital Signs BP (!) 147/85 (BP Location: Right Arm)   Pulse 77   Temp 98.7 F (37.1 C) (Oral)   Resp 16   Ht 6\' 1"  (1.854 m)   Wt 89.4 kg   SpO2 98%   BMI 25.99 kg/m  Physical Exam Vitals and nursing note reviewed.  Constitutional:      General: He is not in acute distress.    Appearance: Normal appearance. He is normal weight. He is not ill-appearing, toxic-appearing or diaphoretic.  HENT:     Head: Normocephalic and atraumatic.  Cardiovascular:     Rate and Rhythm: Normal rate.  Pulmonary:     Effort: Pulmonary effort is normal. No respiratory distress.  Abdominal:     General: Abdomen is flat.     Palpations: Abdomen is soft.     Tenderness: There is no abdominal tenderness.  Musculoskeletal:        General: Normal range of motion.     Cervical back: Normal range of motion.     Right lower leg: No edema.     Left lower leg: No edema.  Comments: No midline tenderness to palpation of the cervical, thoracic, or lumbar spine.  No step-offs, lesions, deformity, or overlying skin changes.  TTP over the left side paraspinous muscles and into the left hip area.  No CVA tenderness.  5/5 strength and sensation intact in bilateral lower extremities.  Patient observed to be ambulatory with a steady gait.  DP and PT pulses intact and 2+.  Skin:    General: Skin is warm and dry.  Neurological:     General: No focal deficit present.     Mental Status: He is alert.  Psychiatric:        Mood and Affect: Mood normal.        Behavior: Behavior normal.     ED Results / Procedures / Treatments   Labs (all labs ordered are listed, but only abnormal results are displayed) Labs Reviewed - No data to display  EKG None  Radiology No results found.  Procedures Procedures     Medications Ordered in ED Medications  ketorolac (TORADOL) 15 MG/ML injection 15 mg (has no administration in time range)  dexamethasone (DECADRON) tablet 4 mg (has no administration in time range)  HYDROcodone-acetaminophen (NORCO/VICODIN) 5-325 MG per tablet 1 tablet (has no administration in time range)  lidocaine (LIDODERM) 5 % 1 patch (has no administration in time range)    ED Course/ Medical Decision Making/ A&P                                 Medical Decision Making Risk Prescription drug management.   Patient presents today with persistent back pain x 1 month.  He is afebrile, nontoxic-appearing, and in no acute distress with reassuring vital signs.  Physical exam reveals no midline tenderness to palpation of the cervical, thoracic, or lumbar spine.  Left-sided paraspinous muscle tenderness to palpation. No neurological deficits and normal neuro exam.  Patient observed to be ambulatory with a steady gait.  No loss of bowel or bladder control.  No concern for cauda equina.  No fever, night sweats, weight loss, h/o cancer, IVDU.  Low suspicion for infectious cause.  No urinary symptoms.  No sensation changes or symptoms to suggest AAA.  Patient's symptoms likely due to sciatica from overuse.  Discussed same with patient who is understanding and in agreement with this. No indication for further evaluation with imaging at this time. Given Toradol, Decadron, Norco, and a lidocaine patch with significant improvement.  Will send prescription for same.  PDMP reviewed.  Patient advised not to drive or operate heavy machinery while taking these medications.  Evaluation and diagnostic testing in the emergency department does not suggest an emergent condition requiring admission or immediate intervention beyond what has been performed at this time.  Plan for discharge with close PCP follow-up.  Patient is understanding and amenable with plan, educated on red flag symptoms that would prompt  immediate return.  Patient discharged in stable condition.  Final Clinical Impression(s) / ED Diagnoses Final diagnoses:  Sciatica of left side    Rx / DC Orders ED Discharge Orders          Ordered    methylPREDNISolone (MEDROL DOSEPAK) 4 MG TBPK tablet        04/30/23 1403    HYDROcodone-acetaminophen (NORCO/VICODIN) 5-325 MG tablet  Every 6 hours PRN        04/30/23 1403    methocarbamol (ROBAXIN) 500 MG tablet  2 times daily  04/30/23 1403    lidocaine (LIDODERM) 5 %  Every 24 hours        04/30/23 1403          An After Visit Summary was printed and given to the patient.     Silva Bandy, PA-C 04/30/23 1406    Sloan Leiter, DO 04/30/23 1559

## 2024-01-19 ENCOUNTER — Other Ambulatory Visit: Payer: Self-pay

## 2024-01-19 ENCOUNTER — Emergency Department (HOSPITAL_COMMUNITY)
Admission: EM | Admit: 2024-01-19 | Discharge: 2024-01-19 | Disposition: A | Discharge status: Home / Self Care | Attending: Emergency Medicine | Admitting: Emergency Medicine

## 2024-01-19 ENCOUNTER — Encounter (HOSPITAL_COMMUNITY): Payer: Self-pay | Admitting: Emergency Medicine

## 2024-01-19 DIAGNOSIS — F17210 Nicotine dependence, cigarettes, uncomplicated: Secondary | ICD-10-CM | POA: Insufficient documentation

## 2024-01-19 DIAGNOSIS — F4310 Post-traumatic stress disorder, unspecified: Secondary | ICD-10-CM | POA: Diagnosis not present

## 2024-01-19 DIAGNOSIS — F331 Major depressive disorder, recurrent, moderate: Secondary | ICD-10-CM

## 2024-01-19 DIAGNOSIS — E119 Type 2 diabetes mellitus without complications: Secondary | ICD-10-CM | POA: Diagnosis not present

## 2024-01-19 DIAGNOSIS — R45851 Suicidal ideations: Secondary | ICD-10-CM | POA: Diagnosis not present

## 2024-01-19 DIAGNOSIS — Z7984 Long term (current) use of oral hypoglycemic drugs: Secondary | ICD-10-CM | POA: Insufficient documentation

## 2024-01-19 DIAGNOSIS — F321 Major depressive disorder, single episode, moderate: Secondary | ICD-10-CM | POA: Diagnosis not present

## 2024-01-19 DIAGNOSIS — F1410 Cocaine abuse, uncomplicated: Secondary | ICD-10-CM

## 2024-01-19 DIAGNOSIS — F1420 Cocaine dependence, uncomplicated: Secondary | ICD-10-CM | POA: Diagnosis not present

## 2024-01-19 DIAGNOSIS — R4182 Altered mental status, unspecified: Secondary | ICD-10-CM | POA: Diagnosis present

## 2024-01-19 HISTORY — DX: Type 2 diabetes mellitus without complications: E11.9

## 2024-01-19 LAB — CBC WITH DIFFERENTIAL/PLATELET
Abs Immature Granulocytes: 0.01 K/uL (ref 0.00–0.07)
Basophils Absolute: 0.1 K/uL (ref 0.0–0.1)
Basophils Relative: 1 %
Eosinophils Absolute: 0.3 K/uL (ref 0.0–0.5)
Eosinophils Relative: 4 %
HCT: 40 % (ref 39.0–52.0)
Hemoglobin: 14.3 g/dL (ref 13.0–17.0)
Immature Granulocytes: 0 %
Lymphocytes Relative: 38 %
Lymphs Abs: 2.4 K/uL (ref 0.7–4.0)
MCH: 32.4 pg (ref 26.0–34.0)
MCHC: 35.8 g/dL (ref 30.0–36.0)
MCV: 90.7 fL (ref 80.0–100.0)
Monocytes Absolute: 0.5 K/uL (ref 0.1–1.0)
Monocytes Relative: 9 %
Neutro Abs: 3.1 K/uL (ref 1.7–7.7)
Neutrophils Relative %: 48 %
Platelets: 244 K/uL (ref 150–400)
RBC: 4.41 MIL/uL (ref 4.22–5.81)
RDW: 13.2 % (ref 11.5–15.5)
WBC: 6.4 K/uL (ref 4.0–10.5)
nRBC: 0 % (ref 0.0–0.2)

## 2024-01-19 LAB — COMPREHENSIVE METABOLIC PANEL WITH GFR
ALT: 19 U/L (ref 0–44)
AST: 25 U/L (ref 15–41)
Albumin: 3.5 g/dL (ref 3.5–5.0)
Alkaline Phosphatase: 97 U/L (ref 38–126)
Anion gap: 11 (ref 5–15)
BUN: 13 mg/dL (ref 6–20)
CO2: 21 mmol/L — ABNORMAL LOW (ref 22–32)
Calcium: 9.1 mg/dL (ref 8.9–10.3)
Chloride: 109 mmol/L (ref 98–111)
Creatinine, Ser: 1.1 mg/dL (ref 0.61–1.24)
GFR, Estimated: >60 mL/min (ref >60)
Glucose, Bld: 177 mg/dL — ABNORMAL HIGH (ref 70–99)
Potassium: 3.4 mmol/L — ABNORMAL LOW (ref 3.5–5.1)
Sodium: 141 mmol/L (ref 135–145)
Total Bilirubin: 0.8 mg/dL (ref 0.0–1.2)
Total Protein: 7.2 g/dL (ref 6.5–8.1)

## 2024-01-19 LAB — RAPID URINE DRUG SCREEN, HOSP PERFORMED
Amphetamines: NOT DETECTED
Barbiturates: NOT DETECTED
Benzodiazepines: NOT DETECTED
Cocaine: POSITIVE — AB
Opiates: NOT DETECTED
Tetrahydrocannabinol: POSITIVE — AB

## 2024-01-19 LAB — ETHANOL: Alcohol, Ethyl (B): <15 mg/dL (ref <15)

## 2024-01-19 NOTE — ED Notes (Signed)
 Patient back in room

## 2024-01-19 NOTE — ED Provider Notes (Signed)
 [place] EMERGENCY DEPARTMENT AT [facility] Provider Note   CSN: [id number] Arrival date & time: 01/19/24  1007     Patient presents with: Addiction Problem   Corey Harrell is a 48 y.o. male.   Patient has a history of cocaine abuse and presents here for severe depression and suicidal thoughts and wanting to get off of cocaine  The history is provided by medical records and the patient. No language interpreter was used.  Altered Mental Status Presenting symptoms: behavior changes   Severity:  Moderate Most recent episode:  More than 2 days ago Episode history:  Continuous Timing:  Constant Progression:  Worsening Chronicity:  Recurrent Context: not alcohol use   Associated symptoms: no abdominal pain, no hallucinations, no headaches, no rash and no seizures        Prior to Admission medications   Medication Sig Start Date End Date Taking? Authorizing Provider  acetaminophen  (TYLENOL ) 500 MG tablet Take 500 mg by mouth every 6 (six) hours as needed. pain     [provider]  doxycycline  (VIBRAMYCIN ) 100 MG capsule Take 1 capsule (100 mg total) by mouth 2 (two) times daily. 12/12/22   Idol, Julie, PA-C  HYDROcodone -acetaminophen  (NORCO/VICODIN) 5-325 MG tablet Take 1 tablet by mouth every 6 (six) hours as needed for severe pain (pain score 7-10). 04/30/23   Smoot, Lauraine DELENA, PA-C  ibuprofen  (ADVIL ,MOTRIN ) 200 MG tablet Take 800 mg by mouth every 6 (six) hours as needed for moderate pain.    [provider]  lidocaine  (LIDODERM ) 5 % Place 1 patch onto the skin daily. Remove & Discard patch within 12 hours or as directed by MD 04/30/23   Smoot, Lauraine DELENA, PA-C  metFORMIN  (GLUCOPHAGE ) 500 MG tablet Take 1 tablet (500 mg total) by mouth daily. 11/13/21   Theotis Peers M, PA-C  methocarbamol  (ROBAXIN ) 500 MG tablet Take 1 tablet (500 mg total) by mouth 2 (two) times daily. 04/30/23   Smoot, Lauraine DELENA, PA-C  methylPREDNISolone  (MEDROL  DOSEPAK) 4 MG TBPK  tablet Take as directed on package 04/30/23   Smoot, Lauraine DELENA, PA-C    Allergies: Penicillins    Review of Systems  Constitutional:  Negative for appetite change and fatigue.  HENT:  Negative for congestion, ear discharge and sinus pressure.   Eyes:  Negative for discharge.  Respiratory:  Negative for cough.   Cardiovascular:  Negative for chest pain.  Gastrointestinal:  Negative for abdominal pain and diarrhea.  Genitourinary:  Negative for frequency and hematuria.  Musculoskeletal:  Negative for back pain.  Skin:  Negative for rash.  Neurological:  Negative for seizures and headaches.  Psychiatric/Behavioral:  Positive for dysphoric mood. Negative for hallucinations.     Updated Vital Signs BP (!) 147/87   Pulse 63   Temp 98.3 F (36.8 C) (Oral)   Resp 17   Ht 6' 1 (1.854 m)   Wt 104.3 kg   SpO2 97%   BMI 30.34 kg/m   Physical Exam Vitals and nursing note reviewed.  Constitutional:      Appearance: He is well-developed.  HENT:     Head: Normocephalic.     Nose: Nose normal.  Eyes:     General: No scleral icterus.    Conjunctiva/sclera: Conjunctivae normal.  Neck:     Thyroid: No thyromegaly.  Cardiovascular:     Rate and Rhythm: Normal rate and regular rhythm.     Heart sounds: No murmur heard.    No friction rub. No gallop.  Pulmonary:     Breath sounds: No stridor. No wheezing or rales.  Chest:     Chest wall: No tenderness.  Abdominal:     General: There is no distension.     Tenderness: There is no abdominal tenderness. There is no rebound.  Musculoskeletal:        General: Normal range of motion.     Cervical back: Neck supple.  Lymphadenopathy:     Cervical: No cervical adenopathy.  Skin:    Findings: No erythema or rash.  Neurological:     Mental Status: He is alert and oriented to person, place, and time.     Motor: No abnormal muscle tone.     Coordination: Coordination normal.  Psychiatric:     Comments: Patient depressed and having  suicidal thoughts     (all labs ordered are listed, but only abnormal results are displayed) Labs Reviewed  COMPREHENSIVE METABOLIC PANEL WITH GFR - Abnormal; Notable for the following components:      Result Value   Potassium 3.4 (*)    CO2 21 (*)    Glucose, Bld 177 (*)    All other components within normal limits  RAPID URINE DRUG SCREEN, HOSP PERFORMED - Abnormal; Notable for the following components:   Cocaine POSITIVE (*)    Tetrahydrocannabinol POSITIVE (*)    All other components within normal limits  CBC WITH DIFFERENTIAL/PLATELET  ETHANOL    EKG: None  Radiology: No results found.   Procedures   Medications Ordered in the ED - No data to display Patient is medically cleared                                 Medical Decision Making Amount and/or Complexity of Data Reviewed Labs: ordered.   Patient is depressed and having suicidal thoughts, he also has cocaine addiction.  He is awaiting behavioral health consult     Final diagnoses:  None    ED Discharge Orders     None          Suzette Pac, MD 01/19/24 1404

## 2024-01-19 NOTE — ED Notes (Signed)
 Patient visualized walking down the hall

## 2024-01-19 NOTE — ED Notes (Signed)
 Patient psych cleared per note. EDP notified. PT to be d/c.

## 2024-01-19 NOTE — BH Assessment (Signed)
 [facility] consult entered at 1430, Jon assisted with coordinating.
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
## 2024-01-19 NOTE — ED Notes (Signed)
Patient left without d/c papers.

## 2024-01-19 NOTE — Discharge Instructions (Addendum)
 Please go to [facility] recovery services for substance abuse treatment. You can also go to [facility] for suicidal thoughts or concerns about drug abuse.   Thank you for coming to [facility] Emergency Department.  Please follow up with your primary care provider within 1 week.   Do not hesitate to return to the ED or call 911 if you experience: -Worsening symptoms -Chest pain -Thoughts of harming yourself or others -Lightheadedness, passing out -Fevers/chills -Anything else that concerns you

## 2024-01-19 NOTE — ED Triage Notes (Addendum)
 Pt bib pov w/ complaints of a substance abuse problem. Pt is tearful in triage and expresses a deep desire to be sober. PT states if he does not get help and get sober the drugs are going to take his life. Pt says he is ready to be clean and wants freedom from drugs. He states he frequently tries but is unable to maintain it. Pt states DOC is cocaine. Pt states his last use was yesterday morning.

## 2024-01-19 NOTE — ED Notes (Signed)
 Pt vocalized nausea. PT was given crackers, ginger ale, and an emesis bag

## 2024-01-19 NOTE — ED Provider Notes (Signed)
 9:26 PM Assumed care of patient from off-going team. For more details, please see note from same day.  In brief, this is a 48 y.o. male who presented for help with cocaine abuse and SI and seeking inpatient treatment.     BP (!) 163/98   Pulse 62   Temp 97.9 F (36.6 C) (Oral)   Resp 16   Ht 6' 1 (1.854 m)   Wt 104.3 kg   SpO2 99%   BMI 30.34 kg/m    ED Course:    Patient was evaluated by psychiatry who cleared him for discharge.  Patient is extremely upset that we cannot get him into an inpatient treatment facility at this time.  I discussed options with him including [facility] and [facility] to which he can go, but patient is very upset. I discussed with patient and encouraged him to continue on his journey to sobriety and that we are here to help him but cannot place him from the ED. I discussed with him and gave him resources for next steps. DC w/ discharge instructions/return precautions. All questions answered to patient's satisfaction.      ------------------------------- Sid Boning, MD Emergency Medicine  This note was created using dictation software, which may contain spelling or grammatical errors.   Boning Sid SAILOR, MD 01/20/24 [phone]
